# Patient Record
Sex: Female | Born: 1945 | Race: White | Hispanic: No | Marital: Married | Smoking: Former smoker
Health system: Southern US, Community
[De-identification: ages and names within clinical notes are randomized; demographics above are authoritative.]

## PROBLEM LIST (undated history)

## (undated) DIAGNOSIS — U071 COVID-19: Secondary | ICD-10-CM

## (undated) DIAGNOSIS — D649 Anemia, unspecified: Secondary | ICD-10-CM

## (undated) DIAGNOSIS — C801 Malignant (primary) neoplasm, unspecified: Secondary | ICD-10-CM

## (undated) DIAGNOSIS — M4800 Spinal stenosis, site unspecified: Secondary | ICD-10-CM

## (undated) DIAGNOSIS — G4730 Sleep apnea, unspecified: Secondary | ICD-10-CM

## (undated) DIAGNOSIS — J309 Allergic rhinitis, unspecified: Secondary | ICD-10-CM

## (undated) DIAGNOSIS — M549 Dorsalgia, unspecified: Secondary | ICD-10-CM

## (undated) DIAGNOSIS — R911 Solitary pulmonary nodule: Secondary | ICD-10-CM

## (undated) DIAGNOSIS — Z8489 Family history of other specified conditions: Secondary | ICD-10-CM

## (undated) DIAGNOSIS — E785 Hyperlipidemia, unspecified: Secondary | ICD-10-CM

## (undated) DIAGNOSIS — J449 Chronic obstructive pulmonary disease, unspecified: Secondary | ICD-10-CM

## (undated) DIAGNOSIS — R197 Diarrhea, unspecified: Secondary | ICD-10-CM

## (undated) DIAGNOSIS — I10 Essential (primary) hypertension: Secondary | ICD-10-CM

## (undated) DIAGNOSIS — D369 Benign neoplasm, unspecified site: Secondary | ICD-10-CM

## (undated) HISTORY — PX: EYE SURGERY: SHX253

## (undated) HISTORY — PX: OVARIAN CYST SURGERY: SHX726

## (undated) HISTORY — PX: MASTECTOMY: SHX3

## (undated) HISTORY — PX: BREAST BIOPSY: SHX20

---

## 2000-11-22 HISTORY — PX: ABDOMINAL HYSTERECTOMY: SHX81

## 2008-11-22 HISTORY — PX: CHOLECYSTECTOMY: SHX55

## 2009-08-13 ENCOUNTER — Ambulatory Visit: Payer: Self-pay | Admitting: General Practice

## 2009-09-09 ENCOUNTER — Ambulatory Visit: Payer: Self-pay | Admitting: General Surgery

## 2009-09-15 ENCOUNTER — Ambulatory Visit: Payer: Self-pay | Admitting: General Surgery

## 2010-10-30 ENCOUNTER — Ambulatory Visit: Payer: Self-pay | Admitting: Internal Medicine

## 2012-01-06 ENCOUNTER — Ambulatory Visit: Payer: Self-pay | Admitting: Internal Medicine

## 2012-12-25 ENCOUNTER — Ambulatory Visit: Payer: Self-pay | Admitting: Ophthalmology

## 2013-02-13 ENCOUNTER — Ambulatory Visit: Payer: Self-pay | Admitting: Internal Medicine

## 2013-10-24 ENCOUNTER — Ambulatory Visit: Payer: Self-pay | Admitting: Internal Medicine

## 2014-02-14 ENCOUNTER — Ambulatory Visit: Payer: Self-pay | Admitting: Internal Medicine

## 2015-11-18 ENCOUNTER — Other Ambulatory Visit: Payer: Self-pay | Admitting: Internal Medicine

## 2015-11-18 DIAGNOSIS — R911 Solitary pulmonary nodule: Secondary | ICD-10-CM

## 2015-11-20 ENCOUNTER — Ambulatory Visit
Admission: RE | Admit: 2015-11-20 | Discharge: 2015-11-20 | Disposition: A | Discharge status: Home / Self Care | Payer: Medicare Other | Source: Ambulatory Visit | Attending: Internal Medicine | Admitting: Internal Medicine

## 2015-11-20 DIAGNOSIS — R911 Solitary pulmonary nodule: Secondary | ICD-10-CM | POA: Insufficient documentation

## 2015-11-20 MED ORDER — IOHEXOL 300 MG/ML  SOLN
75.0000 mL | Freq: Once | INTRAMUSCULAR | Status: AC | PRN
Start: 2015-11-20 — End: 2015-11-20
  Administered 2015-11-20: 75 mL via INTRAVENOUS

## 2015-11-28 ENCOUNTER — Other Ambulatory Visit: Payer: Self-pay | Admitting: Internal Medicine

## 2015-11-28 DIAGNOSIS — R911 Solitary pulmonary nodule: Secondary | ICD-10-CM

## 2016-05-26 ENCOUNTER — Ambulatory Visit
Admission: RE | Admit: 2016-05-26 | Discharge: 2016-05-26 | Disposition: A | Discharge status: Home / Self Care | Payer: Medicare Other | Source: Ambulatory Visit | Attending: Internal Medicine | Admitting: Internal Medicine

## 2016-05-26 ENCOUNTER — Ambulatory Visit: Admission: RE | Admit: 2016-05-26 | Payer: Medicare Other | Source: Ambulatory Visit

## 2016-05-26 DIAGNOSIS — R911 Solitary pulmonary nodule: Secondary | ICD-10-CM | POA: Diagnosis present

## 2016-05-26 LAB — POCT I-STAT CREATININE: CREATININE: 0.6 mg/dL (ref 0.44–1.00)

## 2016-05-26 MED ORDER — IOPAMIDOL (ISOVUE-300) INJECTION 61%
75.0000 mL | Freq: Once | INTRAVENOUS | Status: AC | PRN
  Administered 2016-05-26: 75 mL via INTRAVENOUS

## 2016-07-08 ENCOUNTER — Other Ambulatory Visit: Payer: Self-pay | Admitting: Internal Medicine

## 2016-07-08 DIAGNOSIS — Z1231 Encounter for screening mammogram for malignant neoplasm of breast: Secondary | ICD-10-CM

## 2016-07-15 ENCOUNTER — Other Ambulatory Visit: Payer: Self-pay | Admitting: Internal Medicine

## 2016-07-15 ENCOUNTER — Ambulatory Visit
Admission: RE | Admit: 2016-07-15 | Discharge: 2016-07-15 | Disposition: A | Discharge status: Home / Self Care | Payer: Medicare Other | Source: Ambulatory Visit | Attending: Internal Medicine | Admitting: Internal Medicine

## 2016-07-15 DIAGNOSIS — Z1231 Encounter for screening mammogram for malignant neoplasm of breast: Secondary | ICD-10-CM | POA: Insufficient documentation

## 2016-08-09 ENCOUNTER — Other Ambulatory Visit: Payer: Self-pay | Admitting: Physical Medicine and Rehabilitation

## 2016-08-09 DIAGNOSIS — M5136 Other intervertebral disc degeneration, lumbar region: Secondary | ICD-10-CM

## 2016-09-03 ENCOUNTER — Ambulatory Visit
Admission: RE | Admit: 2016-09-03 | Discharge: 2016-09-03 | Disposition: A | Discharge status: Home / Self Care | Payer: Medicare Other | Source: Ambulatory Visit | Attending: Physical Medicine and Rehabilitation | Admitting: Physical Medicine and Rehabilitation

## 2016-09-03 DIAGNOSIS — M5136 Other intervertebral disc degeneration, lumbar region: Secondary | ICD-10-CM | POA: Insufficient documentation

## 2016-09-03 DIAGNOSIS — M5137 Other intervertebral disc degeneration, lumbosacral region: Secondary | ICD-10-CM | POA: Insufficient documentation

## 2016-09-03 DIAGNOSIS — M47896 Other spondylosis, lumbar region: Secondary | ICD-10-CM | POA: Diagnosis not present

## 2016-09-03 DIAGNOSIS — M4186 Other forms of scoliosis, lumbar region: Secondary | ICD-10-CM | POA: Diagnosis not present

## 2016-09-06 ENCOUNTER — Other Ambulatory Visit: Payer: Self-pay | Admitting: Physical Medicine and Rehabilitation

## 2016-09-06 DIAGNOSIS — M5136 Other intervertebral disc degeneration, lumbar region: Secondary | ICD-10-CM

## 2016-09-06 DIAGNOSIS — M5416 Radiculopathy, lumbar region: Secondary | ICD-10-CM

## 2016-09-06 DIAGNOSIS — D3610 Benign neoplasm of peripheral nerves and autonomic nervous system, unspecified: Secondary | ICD-10-CM

## 2016-09-07 ENCOUNTER — Other Ambulatory Visit: Payer: Self-pay | Admitting: Physical Medicine and Rehabilitation

## 2016-09-28 ENCOUNTER — Encounter: Payer: Self-pay | Admitting: Radiology

## 2016-09-28 ENCOUNTER — Ambulatory Visit
Admission: RE | Admit: 2016-09-28 | Discharge: 2016-09-28 | Disposition: A | Discharge status: Home / Self Care | Payer: Medicare Other | Source: Ambulatory Visit | Attending: Physical Medicine and Rehabilitation | Admitting: Physical Medicine and Rehabilitation

## 2016-09-28 ENCOUNTER — Ambulatory Visit: Admission: RE | Admit: 2016-09-28 | Payer: Medicare Other | Source: Ambulatory Visit

## 2016-09-28 DIAGNOSIS — M5416 Radiculopathy, lumbar region: Secondary | ICD-10-CM

## 2016-09-28 DIAGNOSIS — M419 Scoliosis, unspecified: Secondary | ICD-10-CM | POA: Diagnosis not present

## 2016-09-28 DIAGNOSIS — D3610 Benign neoplasm of peripheral nerves and autonomic nervous system, unspecified: Secondary | ICD-10-CM | POA: Insufficient documentation

## 2016-09-28 DIAGNOSIS — M5116 Intervertebral disc disorders with radiculopathy, lumbar region: Secondary | ICD-10-CM | POA: Diagnosis present

## 2016-09-28 DIAGNOSIS — M5136 Other intervertebral disc degeneration, lumbar region: Secondary | ICD-10-CM

## 2016-09-28 LAB — POCT I-STAT CREATININE: Creatinine, Ser: 0.7 mg/dL (ref 0.44–1.00)

## 2016-09-28 MED ORDER — GADOBENATE DIMEGLUMINE 529 MG/ML IV SOLN
15.0000 mL | Freq: Once | INTRAVENOUS | Status: AC | PRN
  Administered 2016-09-28: 13 mL via INTRAVENOUS

## 2016-11-05 ENCOUNTER — Other Ambulatory Visit: Payer: Self-pay | Admitting: Neurosurgery

## 2016-11-05 DIAGNOSIS — D497 Neoplasm of unspecified behavior of endocrine glands and other parts of nervous system: Secondary | ICD-10-CM

## 2017-02-01 ENCOUNTER — Ambulatory Visit
Admission: RE | Admit: 2017-02-01 | Discharge: 2017-02-01 | Disposition: A | Discharge status: Home / Self Care | Payer: Medicare Other | Source: Ambulatory Visit | Attending: Neurosurgery | Admitting: Neurosurgery

## 2017-02-01 DIAGNOSIS — M47816 Spondylosis without myelopathy or radiculopathy, lumbar region: Secondary | ICD-10-CM | POA: Diagnosis not present

## 2017-02-01 DIAGNOSIS — M419 Scoliosis, unspecified: Secondary | ICD-10-CM | POA: Insufficient documentation

## 2017-02-01 DIAGNOSIS — D497 Neoplasm of unspecified behavior of endocrine glands and other parts of nervous system: Secondary | ICD-10-CM | POA: Insufficient documentation

## 2017-02-01 MED ORDER — GADOBENATE DIMEGLUMINE 529 MG/ML IV SOLN: 15.0000 mL | Freq: Once | INTRAVENOUS | Status: DC | PRN

## 2017-02-17 ENCOUNTER — Other Ambulatory Visit: Payer: Self-pay | Admitting: Neurosurgery

## 2017-02-17 DIAGNOSIS — D497 Neoplasm of unspecified behavior of endocrine glands and other parts of nervous system: Secondary | ICD-10-CM

## 2017-03-03 ENCOUNTER — Other Ambulatory Visit
Admission: RE | Admit: 2017-03-03 | Discharge: 2017-03-03 | Disposition: A | Discharge status: Home / Self Care | Payer: Medicare Other | Source: Ambulatory Visit | Attending: Nurse Practitioner | Admitting: Nurse Practitioner

## 2017-03-03 DIAGNOSIS — R197 Diarrhea, unspecified: Secondary | ICD-10-CM | POA: Diagnosis present

## 2017-03-03 LAB — C DIFFICILE QUICK SCREEN W PCR REFLEX
C Diff antigen: NEGATIVE
C Diff interpretation: NOT DETECTED
C Diff toxin: NEGATIVE

## 2017-03-03 LAB — GASTROINTESTINAL PANEL BY PCR, STOOL (REPLACES STOOL CULTURE)

## 2017-05-16 ENCOUNTER — Encounter: Payer: Self-pay | Admitting: *Deleted

## 2017-05-16 ENCOUNTER — Ambulatory Visit
Admission: RE | Admit: 2017-05-16 | Discharge: 2017-05-16 | Disposition: A | Discharge status: Home / Self Care | Payer: Medicare Other | Source: Ambulatory Visit | Attending: Unknown Physician Specialty | Admitting: Unknown Physician Specialty

## 2017-05-16 ENCOUNTER — Ambulatory Visit: Payer: Medicare Other | Admitting: Certified Registered Nurse Anesthetist

## 2017-05-16 ENCOUNTER — Encounter
Admission: RE | Disposition: A | Discharge status: Home / Self Care | Payer: Self-pay | Source: Ambulatory Visit | Attending: Unknown Physician Specialty

## 2017-05-16 DIAGNOSIS — Z87891 Personal history of nicotine dependence: Secondary | ICD-10-CM | POA: Insufficient documentation

## 2017-05-16 DIAGNOSIS — J449 Chronic obstructive pulmonary disease, unspecified: Secondary | ICD-10-CM | POA: Diagnosis not present

## 2017-05-16 DIAGNOSIS — K648 Other hemorrhoids: Secondary | ICD-10-CM | POA: Insufficient documentation

## 2017-05-16 DIAGNOSIS — Z7982 Long term (current) use of aspirin: Secondary | ICD-10-CM | POA: Diagnosis not present

## 2017-05-16 DIAGNOSIS — Z86010 Personal history of colonic polyps: Secondary | ICD-10-CM | POA: Insufficient documentation

## 2017-05-16 DIAGNOSIS — K621 Rectal polyp: Secondary | ICD-10-CM | POA: Diagnosis not present

## 2017-05-16 DIAGNOSIS — G4730 Sleep apnea, unspecified: Secondary | ICD-10-CM | POA: Diagnosis not present

## 2017-05-16 DIAGNOSIS — E785 Hyperlipidemia, unspecified: Secondary | ICD-10-CM | POA: Diagnosis not present

## 2017-05-16 DIAGNOSIS — D123 Benign neoplasm of transverse colon: Secondary | ICD-10-CM | POA: Diagnosis not present

## 2017-05-16 DIAGNOSIS — Z1211 Encounter for screening for malignant neoplasm of colon: Secondary | ICD-10-CM | POA: Diagnosis not present

## 2017-05-16 DIAGNOSIS — D125 Benign neoplasm of sigmoid colon: Secondary | ICD-10-CM | POA: Insufficient documentation

## 2017-05-16 HISTORY — DX: Allergic rhinitis, unspecified: J30.9

## 2017-05-16 HISTORY — DX: Sleep apnea, unspecified: G47.30

## 2017-05-16 HISTORY — DX: Diarrhea, unspecified: R19.7

## 2017-05-16 HISTORY — DX: Hyperlipidemia, unspecified: E78.5

## 2017-05-16 HISTORY — DX: Benign neoplasm, unspecified site: D36.9

## 2017-05-16 HISTORY — PX: COLONOSCOPY WITH PROPOFOL: SHX5780

## 2017-05-16 HISTORY — DX: Chronic obstructive pulmonary disease, unspecified: J44.9

## 2017-05-16 SURGERY — COLONOSCOPY WITH PROPOFOL
Anesthesia: General

## 2017-05-16 MED ORDER — SODIUM CHLORIDE 0.9 % IV SOLN: INTRAVENOUS | Status: DC

## 2017-05-16 MED ORDER — SODIUM CHLORIDE 0.9 % IV SOLN
INTRAVENOUS | Status: DC
  Administered 2017-05-16: 13:00:00 via INTRAVENOUS

## 2017-05-16 MED ORDER — PROPOFOL 10 MG/ML IV BOLUS
INTRAVENOUS | Status: DC | PRN
  Administered 2017-05-16: 70 mg via INTRAVENOUS
  Administered 2017-05-16: 20 mg via INTRAVENOUS

## 2017-05-16 MED ORDER — PROPOFOL 500 MG/50ML IV EMUL
INTRAVENOUS | Status: AC
  Filled 2017-05-16: qty 50

## 2017-05-16 MED ORDER — LIDOCAINE HCL (CARDIAC) 20 MG/ML IV SOLN
INTRAVENOUS | Status: DC | PRN
  Administered 2017-05-16: 40 mg via INTRAVENOUS

## 2017-05-16 MED ORDER — PROPOFOL 500 MG/50ML IV EMUL
INTRAVENOUS | Status: DC | PRN
  Administered 2017-05-16: 140 ug/kg/min via INTRAVENOUS

## 2017-05-16 NOTE — Transfer of Care (Signed)
Immediate Anesthesia Transfer of Care Note  Patient: Danielle Santana  Procedure(s) Performed: Procedure(s): COLONOSCOPY WITH PROPOFOL (N/A)  Patient Location: PACU  Anesthesia Type:General  Level of Consciousness: awake, alert  and oriented  Airway & Oxygen Therapy: Patient Spontanous Breathing and Patient connected to nasal cannula oxygen  Post-op Assessment: Report given to RN and Post -op Vital signs reviewed and stable  Post vital signs: Reviewed and stable  Last Vitals:  Vitals:   05/16/17 1240  BP: (!) 184/103  Pulse: (!) 110  Resp: 20  Temp: 36.8 C    Last Pain:  Vitals:   05/16/17 1240  TempSrc: Tympanic         Complications: No apparent anesthesia complications

## 2017-05-16 NOTE — Anesthesia Post-op Follow-up Note (Cosign Needed)
Anesthesia QCDR form completed.        

## 2017-05-16 NOTE — H&P (Signed)
Primary Care Physician:  Kirk Ruths, MD Primary Gastroenterologist:  Dr. Vira Agar  Pre-Procedure History & Physical: HPI:  Danielle Santana is a 71 y.o. female is here for an colonoscopy.   Past Medical History:  Diagnosis Date  . Adenomatous polyps   . Allergic rhinitis   . COPD (chronic obstructive pulmonary disease) ([place])   . Diarrhea   . Hyperlipidemia   . Sleep apnea    declines cpap for now     Past Surgical History:  Procedure Laterality Date  . BREAST BIOPSY Left    Stereo biopsy - negative    Prior to Admission medications   Medication Sig Start Date End Date Taking? Authorizing Provider  aspirin 325 MG EC tablet Take 325 mg by mouth daily.   Yes [provider]  augmented betamethasone dipropionate (DIPROLENE-AF) 0.05 % cream Apply topically daily as needed.   Yes [provider]  colestipol (COLESTID) 1 g tablet Take 1 g by mouth 2 (two) times daily.   Yes [provider]  diphenhydrAMINE (BENADRYL) 25 mg capsule Take 25 mg by mouth daily.   Yes [provider]  fluticasone (FLONASE) 50 MCG/ACT nasal spray Place 2 sprays into both nostrils daily as needed for allergies or rhinitis.   Yes [provider]  ibuprofen (ADVIL,MOTRIN) 600 MG tablet Take 600 mg by mouth at bedtime.   Yes [provider]  Multiple Vitamin (MULTIVITAMIN) tablet Take 1 tablet by mouth daily.   Yes [provider]  pravastatin (PRAVACHOL) 40 MG tablet Take 40 mg by mouth every evening.   Yes [provider]  vitamin B-12 (CYANOCOBALAMIN) 100 MCG tablet Take 100 mcg by mouth daily.   Yes [provider]    Allergies as of 03/07/2017  . (No Known Allergies)    Family History  Problem Relation Age of Onset  . Breast cancer Paternal Grandmother     Social History   Social History  . Marital status: Married    Spouse name: N/A  . Number of children: N/A  . Years of education: N/A    Occupational History  . Not on file.   Social History Main Topics  . Smoking status: Former Research scientist (life sciences)  . Smokeless tobacco: Never Used  . Alcohol use Yes     Comment: occasional  . Drug use: No  . Sexual activity: Not on file   Other Topics Concern  . Not on file   Social History Narrative  . No narrative on file    Review of Systems: See HPI, otherwise negative ROS  Physical Exam: BP (!) 184/103   Pulse (!) 110   Temp 98.3 F (36.8 C) (Tympanic)   Resp 20   Ht 5' 2.75" (1.594 m)   Wt 68 kg (150 lb)   SpO2 97%   BMI 26.78 kg/m  General:   Alert,  pleasant and cooperative in NAD Head:  Normocephalic and atraumatic. Neck:  Supple; no masses or thyromegaly. Lungs:  Clear throughout to auscultation.    Heart:  Regular rate and rhythm. Abdomen:  Soft, nontender and nondistended. Normal bowel sounds, without guarding, and without rebound.   Neurologic:  Alert and  oriented x4;  grossly normal neurologically.  Impression/Plan: Danielle Santana is here for an colonoscopy to be [facility]  Risks, benefits, limitations, and alternatives regarding  colonoscopy have been reviewed with the patient.  Questions have been answered.  All parties agreeable.   Gaylyn Cheers, MD  05/16/2017, 1:03 PM

## 2017-05-16 NOTE — Anesthesia Procedure Notes (Signed)
Performed by: Ewing Fandino Pre-anesthesia Checklist: Emergency Drugs available, Patient identified, Suction available, Patient being monitored and Timeout performed Patient Re-evaluated:Patient Re-evaluated prior to inductionOxygen Delivery Method: Nasal cannula Intubation Type: IV induction       

## 2017-05-16 NOTE — Op Note (Signed)
[facility]astroenter[facility] Patient Name: Danielle Santana Procedure Date: 05/16/2017 1:09 PM MRN: [id number] Account #: [id number] Date of Birth: 03-07-1946 Admit Type: Outpatient Age: 71 Room: [facility] ENDO ROOM 3 Gender: Female Note Status: Finalized Procedure:            Colonoscopy Indications:          High risk colon cancer surveillance: Personal history                        of colonic polyps Providers:            Manya Silvas, MD Referring MD:         Ocie Cornfield. Ouida Sills MD, MD (Referring MD) Medicines:            Propofol per Anesthesia Complications:        No immediate complications. Procedure:            Pre-Anesthesia Assessment:                       - After reviewing the risks and benefits, the patient                        was deemed in satisfactory condition to undergo the                        procedure.                       After obtaining informed consent, the colonoscope was                        passed under direct vision. Throughout the procedure,                        the patient's blood pressure, pulse, and oxygen                        saturations were monitored continuously. The                        Colonoscope was introduced through the anus and                        advanced to the the cecum, identified by appendiceal                        orifice and ileocecal valve. The colonoscopy was                        performed without difficulty. The patient tolerated the                        procedure well. The quality of the bowel preparation                        was excellent. Findings:      Four sessile polyps were found in the rectum, sigmoid colon and       transverse colon. The polyps were diminutive in size. These polyps were       removed with a jumbo cold forceps. Resection and retrieval were complete.  Three very small diffuse angioectasias without bleeding were found in       the cecum.      Internal  hemorrhoids were found during endoscopy. The hemorrhoids were       small.      The exam was otherwise without abnormality. Impression:           - Four diminutive polyps in the rectum, in the sigmoid                        colon and in the transverse colon, removed with a jumbo                        cold forceps. Resected and retrieved.                       - Three non-bleeding colonic angioectasias.                       - Internal hemorrhoids.                       - The examination was otherwise normal. Recommendation:       - Await pathology results. Manya Silvas, MD 05/16/2017 1:38:27 PM This report has been signed electronically. Number of Addenda: 0 Note Initiated On: 05/16/2017 1:09 PM Scope Withdrawal Time: 0 hours 16 minutes 26 seconds  Total Procedure Duration: 0 hours 22 minutes 47 seconds       [facility]

## 2017-05-16 NOTE — Anesthesia Preprocedure Evaluation (Signed)
Anesthesia Evaluation  Patient identified by MRN, date of birth, ID band Patient awake    Reviewed: Allergy & Precautions, H&P , NPO status , Patient's Chart, lab work & pertinent test results, reviewed documented beta blocker date and time   Airway Mallampati: III   Neck ROM: full    Dental  (+) Poor Dentition, Teeth Intact   Pulmonary neg pulmonary ROS, sleep apnea and Continuous Positive Airway Pressure Ventilation , COPD, former smoker,    Pulmonary exam normal        Cardiovascular negative cardio ROS Normal cardiovascular exam Rhythm:regular Rate:Normal     Neuro/Psych negative neurological ROS  negative psych ROS   GI/Hepatic negative GI ROS, Neg liver ROS,   Endo/Other  negative endocrine ROS  Renal/GU negative Renal ROS  negative genitourinary   Musculoskeletal   Abdominal   Peds  Hematology negative hematology ROS (+)   Anesthesia Other Findings Past Medical History: No date: Adenomatous polyps No date: Allergic rhinitis No date: COPD (chronic obstructive pulmonary disease) (* No date: Diarrhea No date: Hyperlipidemia No date: Sleep apnea     Comment: declines cpap for now  Past Surgical History: No date: BREAST BIOPSY Left     Comment: Stereo biopsy - negative BMI    Body Mass Index:  26.78 kg/m     Reproductive/Obstetrics negative OB ROS                             Anesthesia Physical Anesthesia Plan  ASA: III  Anesthesia Plan: General   Post-op Pain Management:    Induction:   PONV Risk Score and Plan:   Airway Management Planned:   Additional Equipment:   Intra-op Plan:   Post-operative Plan:   Informed Consent: I have reviewed the patients History and Physical, chart, labs and discussed the procedure including the risks, benefits and alternatives for the proposed anesthesia with the patient or authorized representative who has indicated his/her  understanding and acceptance.   Dental Advisory Given  Plan Discussed with: CRNA  Anesthesia Plan Comments:         Anesthesia Quick Evaluation

## 2017-05-18 LAB — SURGICAL PATHOLOGY

## 2017-05-19 NOTE — Anesthesia Postprocedure Evaluation (Signed)
Anesthesia Post Note  Patient: Danielle Santana  Procedure(s) Performed: Procedure(s) (LRB): COLONOSCOPY WITH PROPOFOL (N/A)  Patient location during evaluation: PACU Anesthesia Type: General Level of consciousness: awake and alert Pain management: pain level controlled Vital Signs Assessment: post-procedure vital signs reviewed and stable Respiratory status: spontaneous breathing, nonlabored ventilation, respiratory function stable and patient connected to nasal cannula oxygen Cardiovascular status: blood pressure returned to baseline and stable Postop Assessment: no signs of nausea or vomiting Anesthetic complications: no     Last Vitals:  Vitals:   05/16/17 1403 05/16/17 1413  BP: (!) 149/99 (!) 171/99  Pulse: 93 94  Resp: 18 17  Temp:      Last Pain:  Vitals:   05/16/17 1343  TempSrc: Tympanic                 Molli Barrows

## 2017-07-09 IMAGING — CT CT CHEST W/ CM
2 of 3 series · 15 of 36 positions shown, 18 images · IV contrast (omnipaque)
Comparison: Report from [facility] study 11/13/2015. Images are not
available for direct comparison.

CLINICAL DATA: Hemoptysis 1 week ago.  Chronic cough.  Smoking.

EXAM:
CT CHEST WITH CONTRAST
TECHNIQUE: Multidetector CT imaging of the chest was performed during
intravenous contrast administration.
CONTRAST:  75mL OMNIPAQUE IOHEXOL 300 MG/ML  SOLN

[Series 2: routine chest with · axial · 0.65mm/px · z∈[-592,-356]mm · 12 of 57 slices shown, 15 images]
[im 5/57  mediastinal]
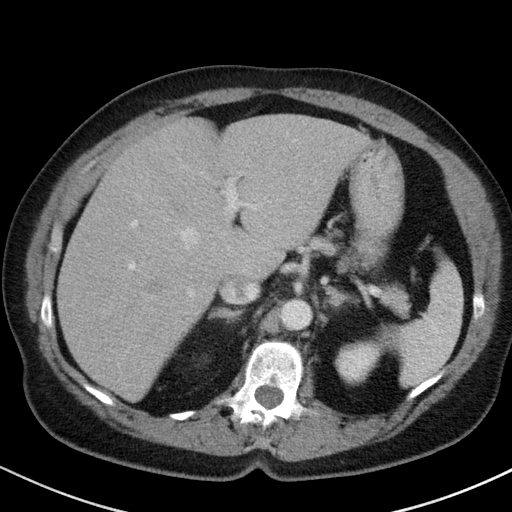
[im 5/57  lung]
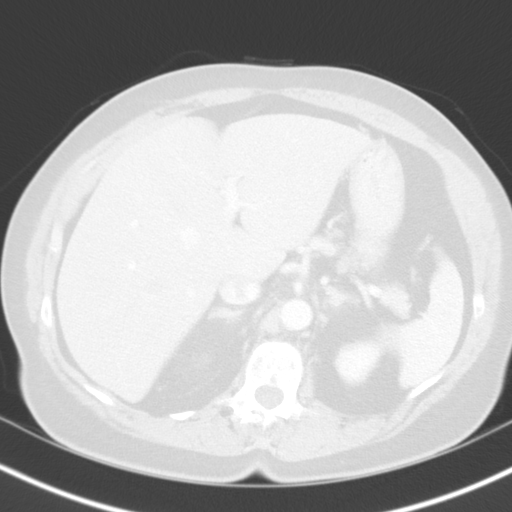
[im 9/57  lung]
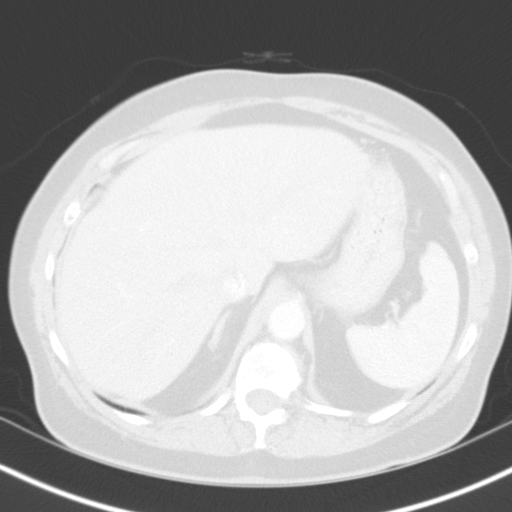
[im 13/57  lung]
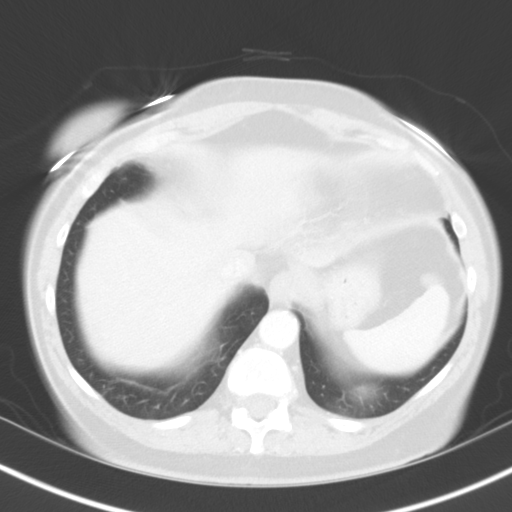
[im 17/57  lung]
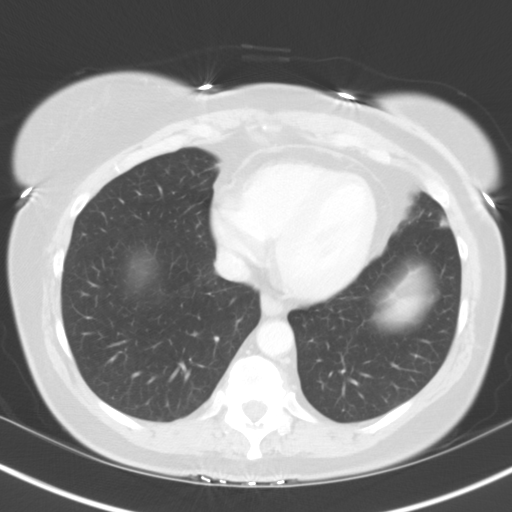
[im 21/57  mediastinal]
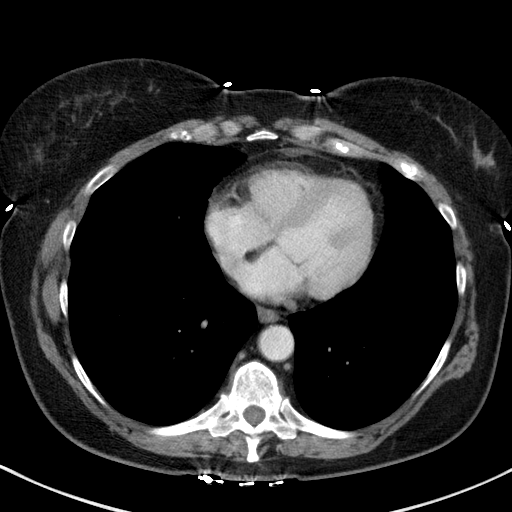
[im 21/57  lung]
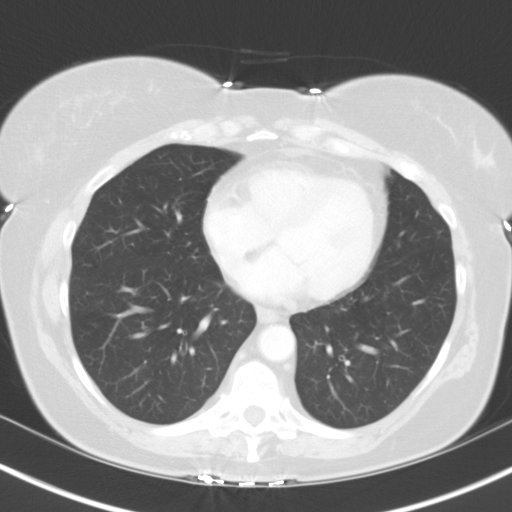
[im 25/57  lung]
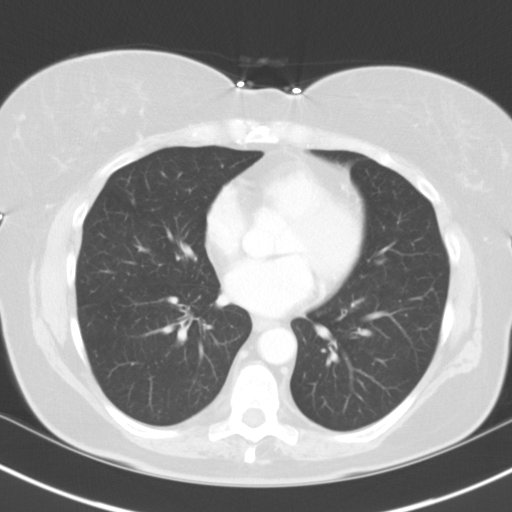
[im 32/57  lung]
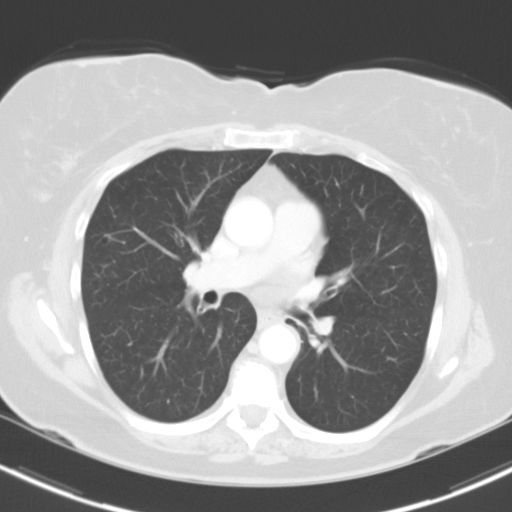
[im 36/57  lung]
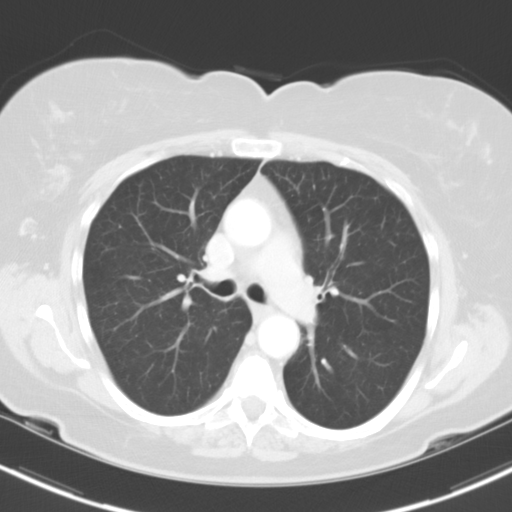
[im 40/57  mediastinal]
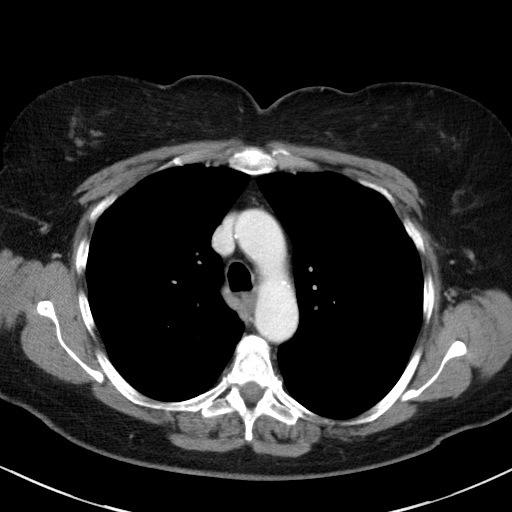
[im 40/57  lung]
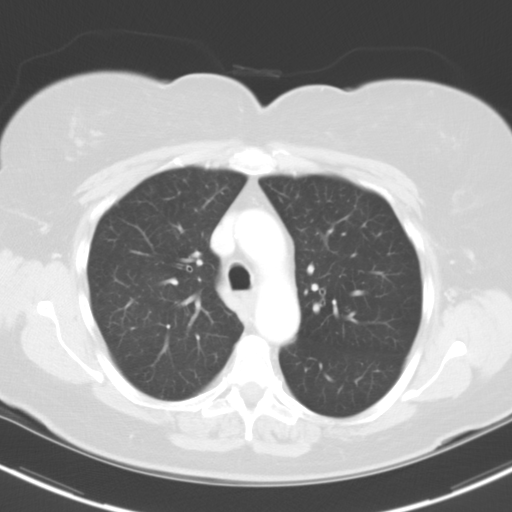
[im 44/57  lung]
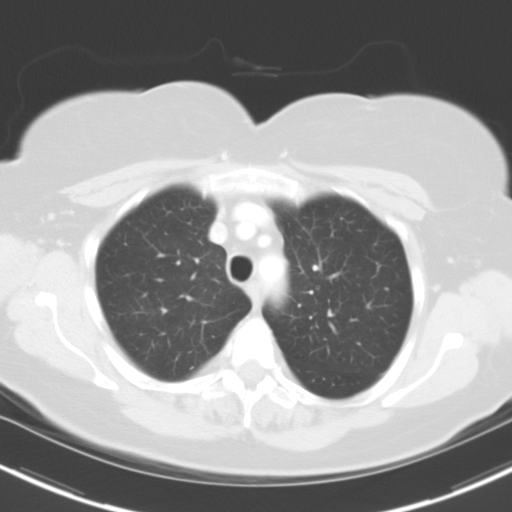
[im 48/57  lung]
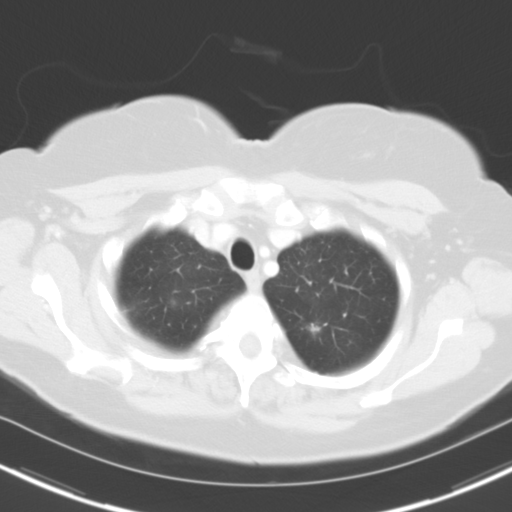
[im 52/57  lung]
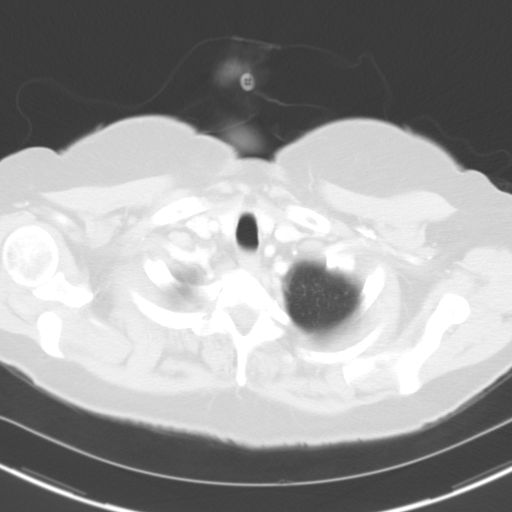

[Series 5: cor routine chest with · coronal · 0.57mm/px · 3 of 146 slices shown]
[im 30/146  lung]
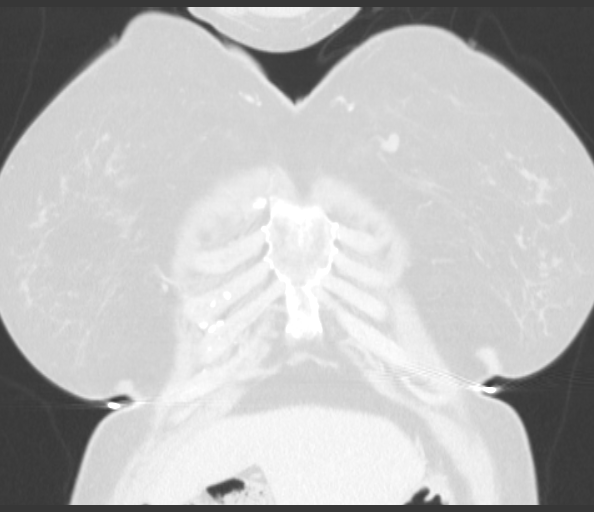
[im 59/146  lung]
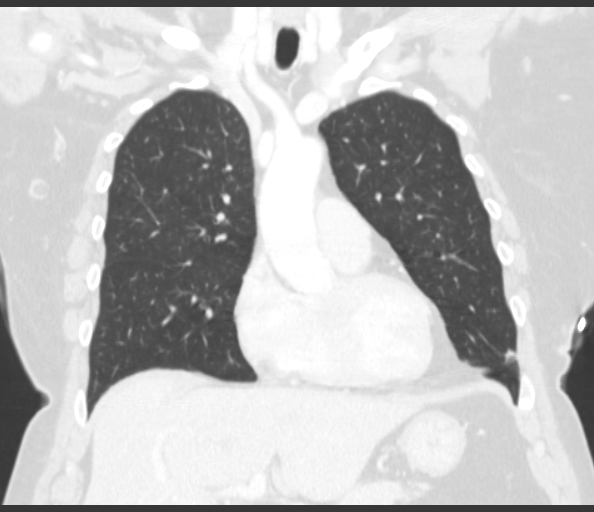
[im 88/146  lung]
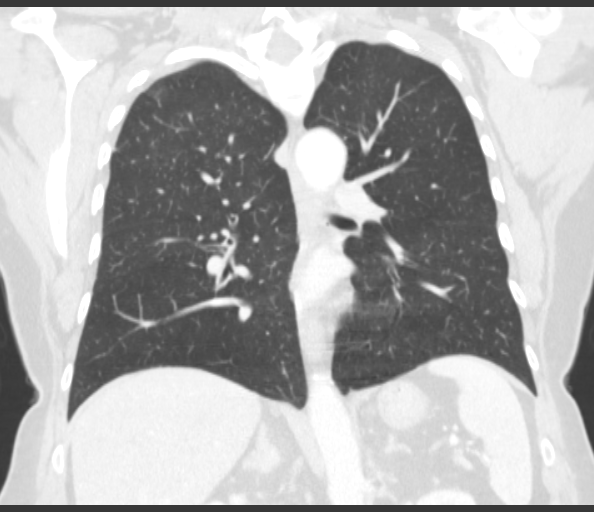

[15 of 36 positions shown; findings below may reference images not displayed]

FINDINGS: No nodule within the left lower lung as questioned on prior study.
There is a nodule in the left upper lobe measuring 6 mm on image 10
that warrants follow-up. No additional pulmonary nodules. No
confluent opacities or effusions.

Heart is normal size. Aorta is normal caliber. No mediastinal,
hilar, or axillary adenopathy. Chest wall soft tissues are
unremarkable. Imaging into the upper abdomen shows no acute
findings.

No acute bony abnormality or focal bone lesion. Degenerative changes
in the thoracic spine.
IMPRESSION: No nodule in the left lower lung. There is a 6 mm mixed ground-glass
and solid nodule in the left upper lobe. Given risk factors for
bronchogenic carcinoma, follow-up chest CT at 6 - 12 months is
recommended. This recommendation follows the consensus statement:
Guidelines for Management of Small Pulmonary Nodules Detected on CT
Scans: A Statement from the [HOSPITAL] as published in

## 2017-11-28 ENCOUNTER — Other Ambulatory Visit: Payer: Self-pay | Admitting: Internal Medicine

## 2017-11-28 DIAGNOSIS — Z1231 Encounter for screening mammogram for malignant neoplasm of breast: Secondary | ICD-10-CM

## 2017-12-01 ENCOUNTER — Other Ambulatory Visit: Payer: Self-pay | Admitting: Internal Medicine

## 2017-12-01 DIAGNOSIS — R911 Solitary pulmonary nodule: Secondary | ICD-10-CM

## 2017-12-12 ENCOUNTER — Other Ambulatory Visit: Payer: Self-pay | Admitting: Internal Medicine

## 2017-12-12 ENCOUNTER — Ambulatory Visit
Admission: RE | Admit: 2017-12-12 | Discharge: 2017-12-12 | Disposition: A | Discharge status: Home / Self Care | Payer: Medicare Other | Source: Ambulatory Visit | Attending: Internal Medicine | Admitting: Internal Medicine

## 2017-12-12 DIAGNOSIS — I700 Atherosclerosis of aorta: Secondary | ICD-10-CM | POA: Diagnosis not present

## 2017-12-12 DIAGNOSIS — R911 Solitary pulmonary nodule: Secondary | ICD-10-CM | POA: Diagnosis present

## 2017-12-12 DIAGNOSIS — R918 Other nonspecific abnormal finding of lung field: Secondary | ICD-10-CM | POA: Diagnosis not present

## 2017-12-12 DIAGNOSIS — N6489 Other specified disorders of breast: Secondary | ICD-10-CM | POA: Diagnosis not present

## 2017-12-14 ENCOUNTER — Other Ambulatory Visit: Payer: Self-pay | Admitting: Internal Medicine

## 2017-12-14 DIAGNOSIS — R911 Solitary pulmonary nodule: Secondary | ICD-10-CM

## 2017-12-20 ENCOUNTER — Ambulatory Visit
Admission: RE | Admit: 2017-12-20 | Discharge: 2017-12-20 | Disposition: A | Discharge status: Home / Self Care | Payer: Medicare Other | Source: Ambulatory Visit | Attending: Internal Medicine | Admitting: Internal Medicine

## 2017-12-20 DIAGNOSIS — N6489 Other specified disorders of breast: Secondary | ICD-10-CM | POA: Diagnosis present

## 2017-12-20 DIAGNOSIS — N6314 Unspecified lump in the right breast, lower inner quadrant: Secondary | ICD-10-CM | POA: Insufficient documentation

## 2017-12-21 ENCOUNTER — Other Ambulatory Visit: Payer: Self-pay | Admitting: Internal Medicine

## 2017-12-21 DIAGNOSIS — N6310 Unspecified lump in the right breast, unspecified quadrant: Secondary | ICD-10-CM

## 2017-12-21 DIAGNOSIS — R928 Other abnormal and inconclusive findings on diagnostic imaging of breast: Secondary | ICD-10-CM

## 2017-12-22 ENCOUNTER — Ambulatory Visit
Admission: RE | Admit: 2017-12-22 | Discharge: 2017-12-22 | Disposition: A | Discharge status: Home / Self Care | Payer: Medicare Other | Source: Ambulatory Visit | Attending: Internal Medicine | Admitting: Internal Medicine

## 2017-12-22 DIAGNOSIS — C50311 Malignant neoplasm of lower-inner quadrant of right female breast: Secondary | ICD-10-CM | POA: Insufficient documentation

## 2017-12-22 DIAGNOSIS — N6310 Unspecified lump in the right breast, unspecified quadrant: Secondary | ICD-10-CM | POA: Diagnosis present

## 2017-12-22 DIAGNOSIS — R928 Other abnormal and inconclusive findings on diagnostic imaging of breast: Secondary | ICD-10-CM

## 2017-12-22 HISTORY — PX: BREAST BIOPSY: SHX20

## 2017-12-26 ENCOUNTER — Other Ambulatory Visit: Payer: Self-pay | Admitting: Pathology

## 2017-12-30 LAB — SURGICAL PATHOLOGY

## 2018-01-02 ENCOUNTER — Encounter: Payer: Self-pay | Admitting: Diagnostic Radiology

## 2018-01-31 ENCOUNTER — Ambulatory Visit
Admission: RE | Admit: 2018-01-31 | Discharge: 2018-01-31 | Disposition: A | Discharge status: Home / Self Care | Payer: Medicare Other | Source: Ambulatory Visit | Attending: Neurosurgery | Admitting: Neurosurgery

## 2018-01-31 DIAGNOSIS — M5136 Other intervertebral disc degeneration, lumbar region: Secondary | ICD-10-CM | POA: Diagnosis not present

## 2018-01-31 DIAGNOSIS — D497 Neoplasm of unspecified behavior of endocrine glands and other parts of nervous system: Secondary | ICD-10-CM | POA: Diagnosis not present

## 2018-01-31 LAB — POCT I-STAT CREATININE: Creatinine, Ser: 0.7 mg/dL (ref 0.44–1.00)

## 2018-01-31 MED ORDER — GADOBENATE DIMEGLUMINE 529 MG/ML IV SOLN
14.0000 mL | Freq: Once | INTRAVENOUS | Status: AC | PRN
  Administered 2018-01-31: 20 mL via INTRAVENOUS

## 2018-04-25 ENCOUNTER — Other Ambulatory Visit
Admission: RE | Admit: 2018-04-25 | Discharge: 2018-04-25 | Disposition: A | Discharge status: Home / Self Care | Payer: Medicare Other | Source: Ambulatory Visit | Attending: Internal Medicine | Admitting: Internal Medicine

## 2018-04-25 DIAGNOSIS — R197 Diarrhea, unspecified: Secondary | ICD-10-CM | POA: Diagnosis present

## 2018-04-25 LAB — C DIFFICILE QUICK SCREEN W PCR REFLEX
C DIFFICLE (CDIFF) ANTIGEN: NEGATIVE
C Diff interpretation: NOT DETECTED
C Diff toxin: NEGATIVE

## 2018-10-24 ENCOUNTER — Other Ambulatory Visit: Payer: Self-pay | Admitting: Internal Medicine

## 2018-10-24 DIAGNOSIS — R918 Other nonspecific abnormal finding of lung field: Secondary | ICD-10-CM

## 2018-12-12 ENCOUNTER — Ambulatory Visit
Admission: RE | Admit: 2018-12-12 | Discharge: 2018-12-12 | Disposition: A | Discharge status: Home / Self Care | Payer: Medicare Other | Source: Ambulatory Visit | Attending: Internal Medicine | Admitting: Internal Medicine

## 2018-12-12 DIAGNOSIS — R918 Other nonspecific abnormal finding of lung field: Secondary | ICD-10-CM | POA: Diagnosis not present

## 2018-12-19 ENCOUNTER — Other Ambulatory Visit: Payer: Self-pay | Admitting: Internal Medicine

## 2018-12-19 DIAGNOSIS — R911 Solitary pulmonary nodule: Secondary | ICD-10-CM

## 2019-12-13 ENCOUNTER — Ambulatory Visit
Admission: RE | Admit: 2019-12-13 | Discharge: 2019-12-13 | Disposition: A | Discharge status: Home / Self Care | Payer: Medicare Other | Source: Ambulatory Visit | Attending: Internal Medicine | Admitting: Internal Medicine

## 2019-12-13 ENCOUNTER — Other Ambulatory Visit: Payer: Self-pay

## 2019-12-13 DIAGNOSIS — R911 Solitary pulmonary nodule: Secondary | ICD-10-CM | POA: Insufficient documentation

## 2020-01-01 ENCOUNTER — Other Ambulatory Visit: Payer: Self-pay | Admitting: Internal Medicine

## 2020-01-01 DIAGNOSIS — R918 Other nonspecific abnormal finding of lung field: Secondary | ICD-10-CM
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
## 2020-06-25 ENCOUNTER — Encounter (INDEPENDENT_AMBULATORY_CARE_PROVIDER_SITE_OTHER): Payer: Self-pay | Admitting: Ophthalmology

## 2020-06-25 ENCOUNTER — Ambulatory Visit (INDEPENDENT_AMBULATORY_CARE_PROVIDER_SITE_OTHER): Payer: Medicare Other | Admitting: Ophthalmology

## 2020-06-25 ENCOUNTER — Other Ambulatory Visit: Payer: Self-pay

## 2020-06-25 DIAGNOSIS — I10 Essential (primary) hypertension: Secondary | ICD-10-CM | POA: Diagnosis not present

## 2020-06-25 DIAGNOSIS — H35033 Hypertensive retinopathy, bilateral: Secondary | ICD-10-CM

## 2020-06-25 DIAGNOSIS — H31002 Unspecified chorioretinal scars, left eye: Secondary | ICD-10-CM | POA: Diagnosis not present

## 2020-06-25 DIAGNOSIS — H3581 Retinal edema: Secondary | ICD-10-CM

## 2020-06-25 DIAGNOSIS — H353121 Nonexudative age-related macular degeneration, left eye, early dry stage: Secondary | ICD-10-CM | POA: Diagnosis not present

## 2020-06-25 DIAGNOSIS — H25813 Combined forms of age-related cataract, bilateral: Secondary | ICD-10-CM

## 2020-10-09 ENCOUNTER — Encounter (INDEPENDENT_AMBULATORY_CARE_PROVIDER_SITE_OTHER): Payer: Self-pay

## 2020-10-09 ENCOUNTER — Encounter (INDEPENDENT_AMBULATORY_CARE_PROVIDER_SITE_OTHER): Payer: Medicare Other | Admitting: Ophthalmology

## 2020-12-03 ENCOUNTER — Ambulatory Visit
Admission: RE | Admit: 2020-12-03 | Discharge: 2020-12-03 | Disposition: A | Discharge status: Home / Self Care | Payer: Medicare Other | Source: Ambulatory Visit | Attending: Internal Medicine | Admitting: Internal Medicine

## 2020-12-03 ENCOUNTER — Other Ambulatory Visit: Payer: Self-pay

## 2020-12-03 DIAGNOSIS — R918 Other nonspecific abnormal finding of lung field: Secondary | ICD-10-CM | POA: Diagnosis not present

## 2021-05-06 ENCOUNTER — Other Ambulatory Visit: Payer: Self-pay | Admitting: Orthopedic Surgery

## 2021-05-06 DIAGNOSIS — M75101 Unspecified rotator cuff tear or rupture of right shoulder, not specified as traumatic: Secondary | ICD-10-CM

## 2021-05-20 ENCOUNTER — Other Ambulatory Visit: Payer: Self-pay | Admitting: Orthopedic Surgery

## 2021-05-20 ENCOUNTER — Other Ambulatory Visit: Payer: Self-pay

## 2021-05-20 ENCOUNTER — Ambulatory Visit
Admission: RE | Admit: 2021-05-20 | Discharge: 2021-05-20 | Disposition: A | Discharge status: Home / Self Care | Payer: Medicare Other | Source: Ambulatory Visit | Attending: Orthopedic Surgery | Admitting: Orthopedic Surgery

## 2021-05-20 DIAGNOSIS — M48061 Spinal stenosis, lumbar region without neurogenic claudication: Secondary | ICD-10-CM

## 2021-05-20 DIAGNOSIS — M75101 Unspecified rotator cuff tear or rupture of right shoulder, not specified as traumatic: Secondary | ICD-10-CM | POA: Diagnosis present

## 2021-05-22 ENCOUNTER — Other Ambulatory Visit: Payer: Self-pay

## 2021-05-22 ENCOUNTER — Ambulatory Visit
Admission: RE | Admit: 2021-05-22 | Discharge: 2021-05-22 | Disposition: A | Discharge status: Home / Self Care | Payer: Medicare Other | Source: Ambulatory Visit | Attending: Orthopedic Surgery | Admitting: Orthopedic Surgery

## 2021-05-22 DIAGNOSIS — M48061 Spinal stenosis, lumbar region without neurogenic claudication: Secondary | ICD-10-CM

## 2021-06-19 ENCOUNTER — Other Ambulatory Visit: Payer: Self-pay | Admitting: Orthopedic Surgery

## 2021-07-06 ENCOUNTER — Encounter
Admission: RE | Admit: 2021-07-06 | Discharge: 2021-07-06 | Disposition: A | Discharge status: Home / Self Care | Payer: Medicare Other | Source: Ambulatory Visit | Attending: Orthopedic Surgery | Admitting: Orthopedic Surgery

## 2021-07-06 ENCOUNTER — Other Ambulatory Visit: Payer: Self-pay

## 2021-07-06 HISTORY — DX: Family history of other specified conditions: Z84.89

## 2021-07-06 HISTORY — DX: COVID-19: U07.1

## 2021-07-06 HISTORY — DX: Solitary pulmonary nodule: R91.1

## 2021-07-06 HISTORY — DX: Anemia, unspecified: D64.9

## 2021-07-06 HISTORY — DX: Spinal stenosis, site unspecified: M48.00

## 2021-07-06 HISTORY — DX: Malignant (primary) neoplasm, unspecified: C80.1

## 2021-07-06 HISTORY — DX: Essential (primary) hypertension: I10

## 2021-07-06 HISTORY — DX: Dorsalgia, unspecified: M54.9

## 2021-07-06 NOTE — Patient Instructions (Addendum)
Your procedure is scheduled on:07-13-21 Monday Report to the Registration Desk on the 1st floor of the [facility].Then proceed to the 2nd floor Surgery Desk in the [facility] To find out your arrival time, please call [phone] between 1PM - 3PM on:07-10-21 Friday  REMEMBER: Instructions that are not followed completely may result in serious medical risk, up to and including death; or upon the discretion of your surgeon and anesthesiologist your surgery may need to be rescheduled.  Do not eat food after midnight the night before surgery.  No gum chewing, lozengers or hard candies.  You may however, drink CLEAR liquids up to 2 hours before you are scheduled to arrive for your surgery. Do not drink anything within 2 hours of your scheduled arrival time.  Clear liquids include: - water  - apple juice without pulp - gatorade  - black coffee or tea (Do NOT add milk or creamers to the coffee or tea) Do NOT drink anything that is not on this list.  In addition, your doctor has ordered for you to drink the provided  Ensure Pre-Surgery Clear Carbohydrate Drink Drinking this carbohydrate drink up to two hours before surgery helps to reduce insulin resistance and improve patient outcomes. Please complete drinking 2 hours prior to scheduled arrival time.  Do not take any medication the day of surgery  Use your Albuterol Inhaler the day of surgery and bring your Albuterol Inhaler to the hospital  Stop your 81 mg Aspirin NOW (07-06-21)  One week prior to surgery: Stop Anti-inflammatories (NSAIDS) such as Mobic (Meloxicam), Advil, Aleve, Ibuprofen, Motrin, Naproxen, Naprosyn and Aspirin based products such as Excedrin, Goodys Powder, BC Powder.You may however, continue to take Tylenol if needed for pain up until the day of surgery.  Stop ANY OVER THE COUNTER supplements/vitamins NOW (07-06-21) until after surgery (Vitamin C, Vitamin B12, Vitamin D and Calcium)  No Alcohol for 24 hours before  or after surgery.  No Smoking including e-cigarettes for 24 hours prior to surgery.  No chewable tobacco products for at least 6 hours prior to surgery.  No nicotine patches on the day of surgery.  Do not use any "recreational" drugs for at least a week prior to your surgery.  Please be advised that the combination of cocaine and anesthesia may have negative outcomes, up to and including death. If you test positive for cocaine, your surgery will be cancelled.  On the morning of surgery brush your teeth with toothpaste and water, you may rinse your mouth with mouthwash if you wish. Do not swallow any toothpaste or mouthwash.  Do not wear jewelry, make-up, hairpins, clips or nail polish.  Do not wear lotions, powders, or perfumes.   Do not shave body from the neck down 48 hours prior to surgery just in case you cut yourself which could leave a site for infection.  Also, freshly shaved skin may become irritated if using the CHG soap.  Contact lenses, hearing aids and dentures may not be worn into surgery.  Do not bring valuables to the hospital. [facility] is not responsible for any missing/lost belongings or valuables.   Use CHG Soap as directed on instruction sheet  Notify your doctor if there is any change in your medical condition (cold, fever, infection).  Wear comfortable clothing (specific to your surgery type) to the hospital.  After surgery, you can help prevent lung complications by doing breathing exercises.  Take deep breaths and cough every 1-2 hours. Your doctor may order a  device called an Incentive Spirometer to help you take deep breaths. When coughing or sneezing, hold a pillow firmly against your incision with both hands. This is called "splinting." Doing this helps protect your incision. It also decreases belly discomfort.  If you are being admitted to the hospital overnight, leave your suitcase in the car. After surgery it may be brought to your room.  If you  are being discharged the day of surgery, you will not be allowed to drive home. You will need a responsible adult (18 years or older) to drive you home and stay with you that night.   If you are taking public transportation, you will need to have a responsible adult (18 years or older) with you. Please confirm with your physician that it is acceptable to use public transportation.   Please call the [facility] Dept. at [phone] if you have any questions about these instructions.  Surgery Visitation Policy:  Patients undergoing a surgery or procedure may have one family member or support person with them as long as that person is not COVID-19 positive or experiencing its symptoms.  That person may remain in the waiting area during the procedure.  Inpatient Visitation:    Visiting hours are 7 a.m. to 8 p.m. Inpatients will be allowed two visitors daily. The visitors may change each day during the patient's stay. No visitors under the age of 6. Any visitor under the age of 66 must be accompanied by an adult. The visitor must pass COVID-19 screenings, use hand sanitizer when entering and exiting the patient's room and wear a mask at all times, including in the patient's room. Patients must also wear a mask when staff or their visitor are in the room. Masking is required regardless of vaccination status.

## 2021-07-07 ENCOUNTER — Encounter
Admission: RE | Admit: 2021-07-07 | Discharge: 2021-07-07 | Disposition: A | Discharge status: Home / Self Care | Payer: Medicare Other | Source: Ambulatory Visit | Attending: Orthopedic Surgery | Admitting: Orthopedic Surgery

## 2021-07-07 DIAGNOSIS — Z01818 Encounter for other preprocedural examination: Secondary | ICD-10-CM | POA: Insufficient documentation

## 2021-07-07 DIAGNOSIS — I10 Essential (primary) hypertension: Secondary | ICD-10-CM | POA: Diagnosis not present

## 2021-07-07 LAB — CBC
HCT: 42 % (ref 36.0–46.0)
Hemoglobin: 14.2 g/dL (ref 12.0–15.0)
MCH: 30.1 pg (ref 26.0–34.0)
MCHC: 33.8 g/dL (ref 30.0–36.0)
MCV: 89.2 fL (ref 80.0–100.0)
Platelets: 323 10*3/uL (ref 150–400)
RBC: 4.71 MIL/uL (ref 3.87–5.11)
RDW: 13.5 % (ref 11.5–15.5)
WBC: 6.9 10*3/uL (ref 4.0–10.5)
nRBC: 0 % (ref 0.0–0.2)

## 2021-07-13 ENCOUNTER — Encounter: Payer: Self-pay | Admitting: Orthopedic Surgery

## 2021-07-13 ENCOUNTER — Ambulatory Visit: Payer: Medicare Other

## 2021-07-13 ENCOUNTER — Ambulatory Visit: Payer: Medicare Other | Admitting: Urgent Care

## 2021-07-13 ENCOUNTER — Ambulatory Visit
Admission: RE | Admit: 2021-07-13 | Discharge: 2021-07-13 | Disposition: A | Discharge status: Home / Self Care | Payer: Medicare Other | Attending: Orthopedic Surgery | Admitting: Orthopedic Surgery

## 2021-07-13 ENCOUNTER — Other Ambulatory Visit: Payer: Self-pay

## 2021-07-13 ENCOUNTER — Encounter
Admission: RE | Disposition: A | Discharge status: Home / Self Care | Payer: Self-pay | Source: Home / Self Care | Attending: Orthopedic Surgery

## 2021-07-13 ENCOUNTER — Ambulatory Visit: Payer: Medicare Other | Admitting: Anesthesiology

## 2021-07-13 DIAGNOSIS — M66811 Spontaneous rupture of other tendons, right shoulder: Secondary | ICD-10-CM | POA: Diagnosis not present

## 2021-07-13 DIAGNOSIS — Z7982 Long term (current) use of aspirin: Secondary | ICD-10-CM | POA: Diagnosis not present

## 2021-07-13 DIAGNOSIS — Z79899 Other long term (current) drug therapy: Secondary | ICD-10-CM | POA: Diagnosis not present

## 2021-07-13 DIAGNOSIS — M65811 Other synovitis and tenosynovitis, right shoulder: Secondary | ICD-10-CM | POA: Diagnosis not present

## 2021-07-13 DIAGNOSIS — M25811 Other specified joint disorders, right shoulder: Secondary | ICD-10-CM | POA: Diagnosis present

## 2021-07-13 DIAGNOSIS — Z419 Encounter for procedure for purposes other than remedying health state, unspecified: Secondary | ICD-10-CM

## 2021-07-13 DIAGNOSIS — Z87891 Personal history of nicotine dependence: Secondary | ICD-10-CM | POA: Insufficient documentation

## 2021-07-13 DIAGNOSIS — M75121 Complete rotator cuff tear or rupture of right shoulder, not specified as traumatic: Secondary | ICD-10-CM | POA: Diagnosis not present

## 2021-07-13 HISTORY — PX: SHOULDER ARTHROSCOPY WITH SUBACROMIAL DECOMPRESSION AND OPEN ROTATOR C: SHX5688

## 2021-07-13 SURGERY — SHOULDER ARTHROSCOPY WITH SUBACROMIAL DECOMPRESSION AND OPEN ROTATOR CUFF REPAIR, OPEN BICEPS TENDON REPAIR
Anesthesia: General | Site: Shoulder | Laterality: Right

## 2021-07-13 MED ORDER — MIDAZOLAM HCL 2 MG/2ML IJ SOLN
INTRAMUSCULAR | Status: AC
  Administered 2021-07-13: 1 mg via INTRAVENOUS
  Filled 2021-07-13: qty 2

## 2021-07-13 MED ORDER — ONDANSETRON HCL 4 MG/2ML IJ SOLN
INTRAMUSCULAR | Status: DC | PRN
  Administered 2021-07-13: 4 mg via INTRAVENOUS

## 2021-07-13 MED ORDER — PHENYLEPHRINE HCL (PRESSORS) 10 MG/ML IV SOLN
INTRAVENOUS | Status: DC | PRN
  Administered 2021-07-13: 100 ug via INTRAVENOUS

## 2021-07-13 MED ORDER — IPRATROPIUM-ALBUTEROL 0.5-2.5 (3) MG/3ML IN SOLN
3.0000 mL | RESPIRATORY_TRACT | Status: DC
  Administered 2021-07-13: 3 mL via RESPIRATORY_TRACT

## 2021-07-13 MED ORDER — GLYCOPYRROLATE 0.2 MG/ML IJ SOLN
INTRAMUSCULAR | Status: AC
  Filled 2021-07-13: qty 2

## 2021-07-13 MED ORDER — LACTATED RINGERS IV SOLN
INTRAVENOUS | Status: DC | PRN
  Administered 2021-07-13: 12000 mL

## 2021-07-13 MED ORDER — ACETAMINOPHEN 10 MG/ML IV SOLN
INTRAVENOUS | Status: AC
  Filled 2021-07-13: qty 100

## 2021-07-13 MED ORDER — LIDOCAINE HCL (CARDIAC) PF 100 MG/5ML IV SOSY
PREFILLED_SYRINGE | INTRAVENOUS | Status: DC | PRN
  Administered 2021-07-13: 100 mg via INTRAVENOUS

## 2021-07-13 MED ORDER — ONDANSETRON HCL 4 MG/2ML IJ SOLN: 4.0000 mg | Freq: Once | INTRAMUSCULAR | Status: DC | PRN

## 2021-07-13 MED ORDER — LACTATED RINGERS IV SOLN: INTRAVENOUS | Status: DC

## 2021-07-13 MED ORDER — EPINEPHRINE PF 1 MG/ML IJ SOLN
INTRAMUSCULAR | Status: AC
  Filled 2021-07-13: qty 4

## 2021-07-13 MED ORDER — DEXAMETHASONE SODIUM PHOSPHATE 10 MG/ML IJ SOLN
INTRAMUSCULAR | Status: DC | PRN
  Administered 2021-07-13: 10 mg via INTRAVENOUS

## 2021-07-13 MED ORDER — ROCURONIUM BROMIDE 10 MG/ML (PF) SYRINGE
PREFILLED_SYRINGE | INTRAVENOUS | Status: AC
  Filled 2021-07-13: qty 10

## 2021-07-13 MED ORDER — SUGAMMADEX SODIUM 500 MG/5ML IV SOLN
INTRAVENOUS | Status: DC | PRN
  Administered 2021-07-13: 300 mg via INTRAVENOUS

## 2021-07-13 MED ORDER — BUPIVACAINE HCL (PF) 0.5 % IJ SOLN
INTRAMUSCULAR | Status: AC
  Filled 2021-07-13: qty 10

## 2021-07-13 MED ORDER — PROPOFOL 10 MG/ML IV BOLUS
INTRAVENOUS | Status: AC
  Filled 2021-07-13: qty 20

## 2021-07-13 MED ORDER — FENTANYL CITRATE (PF) 100 MCG/2ML IJ SOLN
INTRAMUSCULAR | Status: AC
  Administered 2021-07-13: 50 ug via INTRAVENOUS
  Filled 2021-07-13: qty 2

## 2021-07-13 MED ORDER — TRANEXAMIC ACID-NACL 1000-0.7 MG/100ML-% IV SOLN
INTRAVENOUS | Status: AC
  Filled 2021-07-13: qty 100

## 2021-07-13 MED ORDER — SODIUM CHLORIDE 0.9 % IV SOLN
INTRAVENOUS | Status: DC | PRN
  Administered 2021-07-13: 30 ug/min via INTRAVENOUS

## 2021-07-13 MED ORDER — ONDANSETRON 4 MG PO TBDP: 4.0000 mg | ORAL_TABLET | Freq: Three times a day (TID) | ORAL | 0 refills | Status: AC | PRN

## 2021-07-13 MED ORDER — CEFAZOLIN SODIUM-DEXTROSE 2-4 GM/100ML-% IV SOLN
2.0000 g | INTRAVENOUS | Status: AC
  Administered 2021-07-13 (×2): 2 g via INTRAVENOUS

## 2021-07-13 MED ORDER — BUPIVACAINE LIPOSOME 1.3 % IJ SUSP
INTRAMUSCULAR | Status: DC | PRN
  Administered 2021-07-13: 20 mL

## 2021-07-13 MED ORDER — CHLORHEXIDINE GLUCONATE 0.12 % MT SOLN: 15.0000 mL | Freq: Once | OROMUCOSAL | Status: AC

## 2021-07-13 MED ORDER — GLYCOPYRROLATE 0.2 MG/ML IJ SOLN
INTRAMUSCULAR | Status: DC | PRN
Start: 2021-07-13 — End: 2021-07-13
  Administered 2021-07-13: .2 mg via INTRAVENOUS

## 2021-07-13 MED ORDER — BUPIVACAINE HCL (PF) 0.5 % IJ SOLN
INTRAMUSCULAR | Status: DC | PRN
  Administered 2021-07-13: 10 mL

## 2021-07-13 MED ORDER — CEFAZOLIN SODIUM 1 G IJ SOLR
INTRAMUSCULAR | Status: AC
  Filled 2021-07-13: qty 30

## 2021-07-13 MED ORDER — FAMOTIDINE 20 MG PO TABS
ORAL_TABLET | ORAL | Status: AC
  Administered 2021-07-13: 20 mg via ORAL
  Filled 2021-07-13: qty 1

## 2021-07-13 MED ORDER — ROCURONIUM BROMIDE 100 MG/10ML IV SOLN
INTRAVENOUS | Status: DC | PRN
  Administered 2021-07-13: 50 mg via INTRAVENOUS

## 2021-07-13 MED ORDER — CEFAZOLIN SODIUM 1 G IJ SOLR
INTRAMUSCULAR | Status: AC
  Filled 2021-07-13: qty 10

## 2021-07-13 MED ORDER — OXYCODONE HCL 5 MG PO TABS: 5.0000 mg | ORAL_TABLET | ORAL | 0 refills | Status: AC | PRN

## 2021-07-13 MED ORDER — FENTANYL CITRATE (PF) 100 MCG/2ML IJ SOLN: 25.0000 ug | INTRAMUSCULAR | Status: DC | PRN

## 2021-07-13 MED ORDER — VASOPRESSIN 20 UNIT/ML IV SOLN
INTRAVENOUS | Status: AC
  Filled 2021-07-13: qty 1

## 2021-07-13 MED ORDER — LIDOCAINE HCL (PF) 2 % IJ SOLN
INTRAMUSCULAR | Status: AC
  Filled 2021-07-13: qty 10

## 2021-07-13 MED ORDER — SUGAMMADEX SODIUM 500 MG/5ML IV SOLN
INTRAVENOUS | Status: AC
  Filled 2021-07-13: qty 5

## 2021-07-13 MED ORDER — BUPIVACAINE LIPOSOME 1.3 % IJ SUSP
INTRAMUSCULAR | Status: AC
  Filled 2021-07-13: qty 20

## 2021-07-13 MED ORDER — PROPOFOL 10 MG/ML IV BOLUS
INTRAVENOUS | Status: DC | PRN
  Administered 2021-07-13: 150 mg via INTRAVENOUS

## 2021-07-13 MED ORDER — CEFAZOLIN SODIUM-DEXTROSE 2-4 GM/100ML-% IV SOLN
INTRAVENOUS | Status: AC
  Filled 2021-07-13: qty 100

## 2021-07-13 MED ORDER — ASPIRIN EC 325 MG PO TBEC: 325.0000 mg | DELAYED_RELEASE_TABLET | Freq: Every day | ORAL | 0 refills | Status: AC

## 2021-07-13 MED ORDER — FAMOTIDINE 20 MG PO TABS: 20.0000 mg | ORAL_TABLET | Freq: Once | ORAL | Status: AC

## 2021-07-13 MED ORDER — ORAL CARE MOUTH RINSE: 15.0000 mL | Freq: Once | OROMUCOSAL | Status: AC

## 2021-07-13 MED ORDER — ONDANSETRON HCL 4 MG/2ML IJ SOLN
INTRAMUSCULAR | Status: AC
  Filled 2021-07-13: qty 6

## 2021-07-13 MED ORDER — ACETAMINOPHEN 500 MG PO TABS: 1000.0000 mg | ORAL_TABLET | Freq: Three times a day (TID) | ORAL | 2 refills | Status: AC

## 2021-07-13 MED ORDER — CHLORHEXIDINE GLUCONATE 0.12 % MT SOLN
OROMUCOSAL | Status: AC
  Administered 2021-07-13: 15 mL via OROMUCOSAL
  Filled 2021-07-13: qty 15

## 2021-07-13 MED ORDER — FENTANYL CITRATE (PF) 100 MCG/2ML IJ SOLN
50.0000 ug | INTRAMUSCULAR | Status: AC | PRN
  Administered 2021-07-13: 50 ug via INTRAVENOUS

## 2021-07-13 MED ORDER — SUCCINYLCHOLINE CHLORIDE 200 MG/10ML IV SOSY
PREFILLED_SYRINGE | INTRAVENOUS | Status: AC
  Filled 2021-07-13: qty 20

## 2021-07-13 MED ORDER — FENTANYL CITRATE (PF) 100 MCG/2ML IJ SOLN
INTRAMUSCULAR | Status: AC
  Filled 2021-07-13: qty 2

## 2021-07-13 MED ORDER — MEPERIDINE HCL 25 MG/ML IJ SOLN: 6.2500 mg | INTRAMUSCULAR | Status: DC | PRN

## 2021-07-13 MED ORDER — IPRATROPIUM-ALBUTEROL 0.5-2.5 (3) MG/3ML IN SOLN
RESPIRATORY_TRACT | Status: AC
  Filled 2021-07-13: qty 3

## 2021-07-13 MED ORDER — MIDAZOLAM HCL 2 MG/2ML IJ SOLN
1.0000 mg | INTRAMUSCULAR | Status: AC | PRN
  Administered 2021-07-13: 1 mg via INTRAVENOUS

## 2021-07-13 MED ORDER — EPHEDRINE SULFATE 50 MG/ML IJ SOLN
INTRAMUSCULAR | Status: DC | PRN
  Administered 2021-07-13 (×2): 5 mg via INTRAVENOUS

## 2021-07-13 MED ORDER — PROPOFOL 10 MG/ML IV BOLUS
INTRAVENOUS | Status: AC
  Filled 2021-07-13: qty 40

## 2021-07-13 SURGICAL SUPPLY — 86 items
ADAPTER IRRIG TUBE 2 SPIKE SOL (ADAPTER) ×4 IMPLANT
ADH SKN CLS APL DERMABOND .7 (GAUZE/BANDAGES/DRESSINGS) ×1
ADPR TBG 2 SPK PMP STRL ASCP (ADAPTER) ×2
ANCH SUT .5 CRC TPR CT 40X40 (SUTURE) ×1
ANCH SUT 2 SWLK 19.1 CLS EYLT (Anchor) ×3 IMPLANT
ANCH SUT 2X2.3 2 STRN TPE (Anchor) ×1 IMPLANT
ANCH SUT 2X2.3 TAPE (Anchor) ×1 IMPLANT
ANCHOR 2.3 SP SGL 1.2 XBRAID (Anchor) ×2 IMPLANT
ANCHOR ICONIX SPEED 2.0 TAPE (Anchor) ×2 IMPLANT
ANCHOR SWIVELOCK BIO 4.75X19.1 (Anchor) ×6 IMPLANT
APL PRP STRL LF DISP 70% ISPRP (MISCELLANEOUS) ×1
BLADE SHAVER 4.5X7 STR FR (MISCELLANEOUS) ×2 IMPLANT
BNDG ADH 2 X3.75 FABRIC TAN LF (GAUZE/BANDAGES/DRESSINGS) ×2 IMPLANT
BNDG ADH XL 3.75X2 STRCH LF (GAUZE/BANDAGES/DRESSINGS) ×1
BUR BR 5.5 WIDE MOUTH (BURR) ×2 IMPLANT
CANNULA PART THRD DISP 5.75X7 (CANNULA) IMPLANT
CANNULA PARTIAL THREAD 2X7 (CANNULA) ×2 IMPLANT
CANNULA TWIST IN 8.25X7CM (CANNULA) ×2 IMPLANT
CANNULA TWIST IN 8.25X9CM (CANNULA) IMPLANT
CHLORAPREP W/TINT 26 (MISCELLANEOUS) ×2 IMPLANT
COOLER POLAR GLACIER W/PUMP (MISCELLANEOUS) ×2 IMPLANT
DERMABOND ADVANCED (GAUZE/BANDAGES/DRESSINGS) ×1
DERMABOND ADVANCED .7 DNX12 (GAUZE/BANDAGES/DRESSINGS) ×1 IMPLANT
DRAPE 3/4 80X56 (DRAPES) ×2 IMPLANT
DRAPE IMP U-DRAPE 54X76 (DRAPES) ×4 IMPLANT
DRAPE INCISE IOBAN 66X45 STRL (DRAPES) ×2 IMPLANT
DRAPE U-SHAPE 47X51 STRL (DRAPES) ×4 IMPLANT
DRSG TEGADERM 4X4.75 (GAUZE/BANDAGES/DRESSINGS) ×6 IMPLANT
ELECT REM PT RETURN 9FT ADLT (ELECTROSURGICAL) ×2
ELECTRODE REM PT RTRN 9FT ADLT (ELECTROSURGICAL) ×1 IMPLANT
GAUZE SPONGE 4X4 12PLY STRL (GAUZE/BANDAGES/DRESSINGS) ×2 IMPLANT
GAUZE XEROFORM 1X8 LF (GAUZE/BANDAGES/DRESSINGS) ×2 IMPLANT
GLOVE SRG 8 PF TXTR STRL LF DI (GLOVE) ×2 IMPLANT
GLOVE SURG ENC MOIS LTX SZ7.5 (GLOVE) ×2 IMPLANT
GLOVE SURG ORTHO LTX SZ8 (GLOVE) ×2 IMPLANT
GLOVE SURG SYN 8.0 (GLOVE) ×2 IMPLANT
GLOVE SURG UNDER POLY LF SZ8 (GLOVE) ×4
GOWN STRL REUS W/ TWL LRG LVL3 (GOWN DISPOSABLE) ×2 IMPLANT
GOWN STRL REUS W/TWL LRG LVL3 (GOWN DISPOSABLE) ×4
GOWN STRL REUS W/TWL XL LVL4 (GOWN DISPOSABLE) ×2 IMPLANT
IV LACTATED RINGER IRRG 3000ML (IV SOLUTION) ×40
IV LR IRRIG 3000ML ARTHROMATIC (IV SOLUTION) ×20 IMPLANT
KIT CORKSCREW KNTLS 3.9 S/T/P (INSTRUMENTS) IMPLANT
KIT STABILIZATION SHOULDER (MISCELLANEOUS) ×2 IMPLANT
KIT SUTURETAK 3.0 INSERT PERC (KITS) IMPLANT
KIT TURNOVER KIT A (KITS) ×2 IMPLANT
MANIFOLD NEPTUNE II (INSTRUMENTS) ×4 IMPLANT
MASK FACE SPIDER DISP (MASK) ×2 IMPLANT
MAT ABSORB  FLUID 56X50 GRAY (MISCELLANEOUS) ×2
MAT ABSORB FLUID 56X50 GRAY (MISCELLANEOUS) ×2 IMPLANT
NDL MAYO CATGUT SZ5 (NEEDLE)
NDL SAFETY ECLIPSE 18X1.5 (NEEDLE) ×1 IMPLANT
NDL SUT 5 .5 CRC TPR PNT MAYO (NEEDLE) IMPLANT
NEEDLE HYPO 18GX1.5 SHARP (NEEDLE) ×2
NEEDLE SCORPION MULTI FIRE (NEEDLE) IMPLANT
PACK ARTHROSCOPY SHOULDER (MISCELLANEOUS) ×2 IMPLANT
PAD ARMBOARD 7.5X6 YLW CONV (MISCELLANEOUS) ×2 IMPLANT
PAD WRAPON POLAR SHDR XLG (MISCELLANEOUS) ×1 IMPLANT
PASSER SUT FIRSTPASS SELF (INSTRUMENTS) ×4 IMPLANT
PASSER SUT SWIFTSTITCH HIP CRT (INSTRUMENTS) ×2 IMPLANT
PENCIL SMOKE EVACUATOR (MISCELLANEOUS) ×2 IMPLANT
SHAVER BLADE BONE CUTTER  5.5 (BLADE) ×1
SHAVER BLADE BONE CUTTER 5.5 (BLADE) ×1 IMPLANT
SLING ULTRA II M (MISCELLANEOUS) ×2 IMPLANT
SPONGE T-LAP 18X18 ~~LOC~~+RFID (SPONGE) ×2 IMPLANT
STAPLER SKIN PROX 35W (STAPLE) IMPLANT
STRAP SAFETY 5IN WIDE (MISCELLANEOUS) ×2 IMPLANT
SUT ETHILON 3-0 (SUTURE) ×2 IMPLANT
SUT LASSO 90 DEG CVD (SUTURE) IMPLANT
SUT LASSO 90 DEG SD STR (SUTURE) IMPLANT
SUT MNCRL 4-0 (SUTURE)
SUT MNCRL 4-0 27XMFL (SUTURE)
SUT PROLENE 0 CT 2 (SUTURE) IMPLANT
SUT VIC AB 0 CT1 36 (SUTURE) IMPLANT
SUT VIC AB 2-0 CT2 27 (SUTURE) IMPLANT
SUT XBRAID 1.4 BLUE (SUTURE) ×2 IMPLANT
SUTURE MNCRL 4-0 27XMF (SUTURE) IMPLANT
SUTURE TAPE 1.3 40 TPR END (SUTURE) ×1 IMPLANT
SUTURETAPE 1.3 40 TPR END (SUTURE) ×2
TAPE CLOTH 3X10 WHT NS LF (GAUZE/BANDAGES/DRESSINGS) ×2 IMPLANT
TAPE MICROFOAM 4IN (TAPE) ×2 IMPLANT
TUBING CONNECTING 10 (TUBING) IMPLANT
TUBING INFLOW SET DBFLO PUMP (TUBING) ×2 IMPLANT
TUBING OUTFLOW SET DBLFO PUMP (TUBING) ×2 IMPLANT
WAND WEREWOLF FLOW 90D (MISCELLANEOUS) ×2 IMPLANT
WRAPON POLAR PAD SHDR XLG (MISCELLANEOUS) ×2

## 2021-07-13 NOTE — Progress Notes (Signed)
Pt's HR 102 and O2 is 94% and Dr. Rosey Bath is okay with those numbers and the patient can be discharged home.

## 2021-07-13 NOTE — Discharge Instructions (Addendum)
Post-Op Instructions - Rotator Cuff Repair  1. Bracing: You will wear a shoulder immobilizer or sling for 6 weeks.   2. Driving: No driving for 3 weeks post-op. When driving, do not wear the immobilizer. Ideally, we recommend no driving for 6 weeks while sling is in place as one arm will be immobilized.   3. Activity: No active lifting for 2 months. Wrist, hand, and elbow motion only. Avoid lifting the upper arm away from the body except for hygiene. You are permitted to bend and straighten the elbow passively only (no active elbow motion). You may use your hand and wrist for typing, writing, and managing utensils (cutting food). Do not lift more than a coffee cup for 8 weeks.  When sleeping or resting, inclined positions (recliner chair or wedge pillow) and a pillow under the forearm for support may provide better comfort for up to 4 weeks.  Avoid long distance travel for 4 weeks.  Return to normal activities after rotator cuff repair repair normally takes 6 months on average. If rehab goes very well, may be able to do most activities at 4 months, except overhead or contact sports.  4. Physical Therapy: Begins 3-4 days after surgery, and proceed 1 time per week for the first 6 weeks, then 1-2 times per week from weeks 6-20 post-op.  5. Medications:  - You will be provided a prescription for narcotic pain medicine. After surgery, take 1-2 narcotic tablets every 4 hours if needed for severe pain.  - A prescription for anti-nausea medication will be provided in case the narcotic medicine causes nausea - take 1 tablet every 6 hours only if nauseated.   - Take tylenol 1000 mg (2 Extra Strength tablets or 3 regular strength) every 8 hours for pain.  May decrease or stop tylenol 5 days after surgery if you are having minimal pain. - Take ASA 325mg/day x 2 weeks to help prevent DVTs/PEs (blood clots).  - DO NOT take ANY nonsteroidal anti-inflammatory pain medications (Advil, Motrin, Ibuprofen, Aleve,  Naproxen, or Naprosyn). These medicines can inhibit healing of your shoulder repair.    If you are taking prescription medication for anxiety, depression, insomnia, muscle spasm, chronic pain, or for attention deficit disorder, you are advised that you are at a higher risk of adverse effects with use of narcotics post-op, including narcotic addiction/dependence, depressed breathing, death. If you use non-prescribed substances: alcohol, marijuana, cocaine, heroin, methamphetamines, etc., you are at a higher risk of adverse effects with use of narcotics post-op, including narcotic addiction/dependence, depressed breathing, death. You are advised that taking > 50 morphine milligram equivalents (MME) of narcotic pain medication per day results in twice the risk of overdose or death. For your prescription provided: oxycodone 5 mg - taking more than 6 tablets per day would result in > 50 morphine milligram equivalents (MME) of narcotic pain medication. Be advised that we will prescribe narcotics short-term, for acute post-operative pain only - 3 weeks for major operations such as shoulder repair/reconstruction surgeries.     6. Post-Op Appointment:  Your first post-op appointment will be 10-14 days post-op.  7. Work or School: For most, but not all procedures, we advise staying out of work or school for at least 1 to 2 weeks in order to recover from the stress of surgery and to allow time for healing.   If you need a work or school note this can be provided.   8. Smoking: If you are a smoker, you need to refrain from   smoking in the postoperative period. The nicotine in cigarettes will inhibit healing of your shoulder repair and decrease the chance of successful repair. Similarly, nicotine containing products (gum, patches) should be avoided.   Post-operative Brace: Apply and remove the brace you received as you were instructed to at the time of fitting and as described in detail as the brace's  instructions for use indicate.  Wear the brace for the period of time prescribed by your physician.  The brace can be cleaned with soap and water and allowed to air dry only.  Should the brace result in increased pain, decreased feeling (numbness/tingling), increased swelling or an overall worsening of your medical condition, please contact your doctor immediately.  If an emergency situation occurs as a result of wearing the brace after normal business hours, please dial 911 and seek immediate medical attention.  Let your doctor know if you have any further questions about the brace issued to you. Refer to the shoulder sling instructions for use if you have any questions regarding the correct fit of your shoulder sling.  [facility]: [phone]  Video that illustrates how to properly use a shoulder sling: "Instructions for Proper Use of an Orthopaedic Sling" [email or web address]    AMBULATORY SURGERY  DISCHARGE INSTRUCTIONS   The drugs that you were given will stay in your system until tomorrow so for the next 24 hours you should not:  Drive an automobile Make any legal decisions Drink any alcoholic beverage   You may resume regular meals tomorrow.  Today it is better to start with liquids and gradually work up to solid foods.  You may eat anything you prefer, but it is better to start with liquids, then soup and crackers, and gradually work up to solid foods.   Please notify your doctor immediately if you have any unusual bleeding, trouble breathing, redness and pain at the surgery site, drainage, fever, or pain not relieved by medication.    Additional Instructions:        Please contact your physician with any problems or Same Day Surgery at [phone], Monday through Friday 6 am to 4 pm, or [place] [facility] number at [phone].       Interscalene Nerve Block with Exparel  -- WEAR GREEN BRACELET FOR 3  DAYS   For your surgery you have received an Interscalene Nerve Block with Exparel. Nerve Blocks affect many types of nerves, including nerves that control movement, pain and normal sensation.  You may experience feelings such as numbness, tingling, heaviness, weakness or the inability to move your arm or the feeling or sensation that your arm has "fallen asleep". A nerve block with Exparel can last up to 5 days.  Usually the weakness wears off first.  The tingling and heaviness usually wear off next.  Finally you may start to notice pain.  Keep in mind that this may occur in any order.  Once a nerve block starts to wear off it is usually completely gone within 60 minutes. ISNB may cause mild shortness of breath, a hoarse voice, blurry vision, unequal pupils, or drooping of the face on the same side as the nerve block.  These symptoms will usually resolve with the numbness.  Very rarely the procedure itself can cause mild seizures. If needed, your surgeon will give you a prescription for pain medication.  It will take about 60 minutes for the oral pain medication to become fully effective.  So, it is recommended that you  start taking this medication before the nerve block first begins to wear off, or when you first begin to feel discomfort. Take your pain medication only as prescribed.  Pain medication can cause sedation and decrease your breathing if you take more than you need for the level of pain that you have. Nausea is a common side effect of many pain medications.  You may want to eat something before taking your pain medicine to prevent nausea. After an Interscalene nerve block, you cannot feel pain, pressure or extremes in temperature in the effected arm.  Because your arm is numb it is at an increased risk for injury.  To decrease the possibility of injury, please practice the following:  While you are awake change the position of your arm frequently to prevent too much pressure on any one area  for prolonged periods of time.  If you have a cast or tight dressing, check the color or your fingers every couple of hours.  Call your surgeon with the appearance of any discoloration (white or blue). If you are given a sling to wear before you go home, please wear it  at all times until the block has completely worn off.  Do not get up at night without your sling. Please contact [facility] or your surgeon if you do not begin to regain sensation after 7 days from the surgery.  Anesthesia may be contacted by calling the Same Day Surgery Department, Mon. through Fri., 6 am to 4 pm at [phone].   If you experience any other problems or concerns, please contact your surgeon's office. If you experience severe or prolonged shortness of breath go to the nearest emergency department.

## 2021-07-13 NOTE — H&P (Signed)
Paper H&P to be scanned into permanent record. H&P reviewed. No significant changes noted.  

## 2021-07-13 NOTE — Op Note (Signed)
SURGERY DATE: 07/13/2021   PRE-OP DIAGNOSIS:  1. Right subacromial impingement 2. Right proximal biceps rupture 3. Right rotator cuff tear (subscapularis and supraspinatus)   POST-OP DIAGNOSIS: 1. Right subacromial impingement 2. Right proximal biceps rupture 3. Right rotator cuff tear (subscapularis and supraspinatus)   PROCEDURES:  1. Right arthroscopic rotator cuff repair  (subscapularis and supraspinatus) 2. Right arthroscopic subacromial decompression 3. Right arthroscopic extensive debridement of shoulder (glenohumeral and subacromial spaces)   SURGEON: Cato Mulligan, MD   ASSISTANT: Anitra Lauth, PA   ANESTHESIA: Gen with Exparel interscalene block   ESTIMATED BLOOD LOSS: 5cc   DRAINS:  none   TOTAL IV FLUIDS: per anesthesia      SPECIMENS: none   IMPLANTS:  - Arthrex 4.65m SwiveLock x 3 - Iconix SPEED double loaded with 1.2 and 2.067mtape x 2     OPERATIVE FINDINGS:  Examination under anesthesia: A careful examination under anesthesia was performed.  Passive range of motion was: FF: 150; ER at side: 45; ER in abduction: 90; IR in abduction: 45.  Anterior load shift: NT.  Posterior load shift: NT.  Sulcus in neutral: NT.  Sulcus in ER: NT.     Intra-operative findings: A thorough arthroscopic examination of the shoulder was performed.  The findings are: 1. Biceps tendon: Not visualized intra-articularly 2. Superior labrum: erythema 3. Posterior labrum and capsule: Degenerative tearing of the posterior labrum 4. Inferior capsule and inferior recess: normal 5. Glenoid cartilage surface: Grade 1 degenerative changes of inferior glenoid 6. Supraspinatus attachment: full-thickness tear of the supraspinatus 7. Posterior rotator cuff attachment: normal 8. Humeral head articular cartilage: Focal area of grade 2 degenerative change 9. Rotator interval: significant synovitis 10: Subscapularis tendon: Full-thickness tear of the superior fibers 11. Anterior labrum:  Mildly degenerative 12. IGHL: normal   OPERATIVE REPORT:    Indications for procedure:  Danielle Santana a [age].o. female with over 9 months of right shoulder pain. Clinical exam and MRI were suggestive of rotator cuff tear involving the supraspinatus and subscapularis, biceps rupture, and subacromial impingement/bursitis.  She had failed extensive nonoperative management including NSAIDs, exercises, and corticosteroid. After discussion of risks, benefits, and alternatives to surgery, the patient elected to proceed.     Procedure in detail:   I identified Danielle Santana the pre-operative holding area.  I marked the operative shoulder with my initials. I reviewed the risks and benefits of the proposed surgical intervention, and the patient wished to proceed.  Anesthesia was then performed with an Exparel interscalene block.  The patient was transferred to the operative suite and placed in the beach chair position.     Appropriate IV antibiotics were administered prior to incision. The operative upper extremity was then prepped and draped in standard fashion. A time out was performed confirming the correct extremity, correct patient, and correct procedure.    I then created a standard posterior portal with an 11 blade. The glenohumeral joint was easily entered with a blunt trocar and the arthroscope introduced. The findings of diagnostic arthroscopy are described above.  Using an oscillating shaver, I debrided degenerative tissue including the synovitic tissue about the rotator interval and anterior and superior labrum.  I also debrided the biceps anchor complex down to a stable base. I then coagulated the inflamed synovium to obtain hemostasis and reduce the risk of post-operative swelling using an Arthrocare radiofrequency device.   I then turned my attention to the arthroscopic subscapularis repair.  A superior anterolateral portal  was made under needle localization.  A 7 mm cannula was placed.   The, comma tissue indicating the superolateral border of the subscapularis was identified readily.  The tip of the coracoid as well as the conjoined tendon and coracoacromial ligaments were visualized after debriding rotator interval tissue.  Tissue about the subscapularis was released anteriorly, superiorly, and posteriorly to allow for improved mobilization.  Using a 70 degree arthroscope, the lesser tuberosity footprint was prepared with a combination of electrocautery and curette. 2 suture tapes were passed in a mattress fashion.  All 4 strands of suture were then loaded onto a 4.75 mm SwiveLock anchor and placed into the prepared footprint with the arm in a neutral position.  This construct appropriately reduced the subscapularis tear.  Utilizing the knotless mechanism of the anchor, an additional stitch was passed through the superior aspect of the subscapularis to further reduce the tear.  The arm was then internally and externally rotated and the subscapularis was noted to move appropriately with rotation.  The remainder of the suture was then cut.   Next, the arthroscope was then introduced into the subacromial space. A direct lateral portal was created with an 11-blade after spinal needle localization. An extensive subacromial bursectomy was performed using a combination of the shaver and Arthrocare wand. The entire acromial undersurface was exposed and the CA ligament was subperiosteally elevated to expose the anterior acromial hook. A burr was used to create a flat anterior and lateral aspect of the acromion, converting it from a Type 2 to a Type 1 acromion. Care was made to keep the deltoid fascia intact.   Next, the arthroscopic supraspinatus repair was performed.  I debrided the poor quality edges of the supraspinatus tendon.  This was a U-shaped tear of the supraspinatus.  I prepared the footprint using a burr to expose bleeding bone.    I then percutaneously placed 1 Iconix SPEED medial row  anchor along the anterior portion of the tear at the articular margin. Another SPEED anchor was placed percutaneously along the posterior portion of the tear at the articular margin. I then shuttled all 8 strands of tape through the rotator cuff just lateral to the musculotendinous junction using a FirstPass suture passer spanning the anterior to posterior extent of the tear. The posterior strands of each suture were passed through an [employer] anchor.  This was placed approximately 2 cm distal to the lateral edge of the footprint in line with the posterior aspect of the tear with appropriate tensioning of each suture prior to final fixation.  Similarly, the anterior strands of each suture were passed through another SwiveLock anchor along the anterior margin of the tear. This construct allowed for excellent reapproximation of the rotator cuff to its native footprint without undue tension.  Appropriate compression was achieved.  The repair was stable to external and internal rotation.   Fluid was evacuated from the shoulder, and the portals were closed with 3-0 Nylon. Xeroform was applied to the portals. A sterile dressing was applied, followed by a Polar Care sleeve and a SlingShot shoulder immobilizer/sling. The patient was awakened from anesthesia without difficulty and was transferred to the PACU in stable condition.    Of note, assistance from a PA was essential to performing the surgery.  PA assisted with patient positioning, retraction, and instrumentation. The surgery would have been more difficult and had longer operative time without PA assistance.   Additionally, this case had increased complexity compared to standard arthroscopic rotator cuff repair  given the involvement of the subscapularis tear. Repair of this tear increased surgical time by 45 minutes and increased complexity due to additional preparation and repair of subscapularis tear, use of additional implants, and creation of an  accessory portal, all of which would have otherwise not occurred.  COMPLICATIONS: none   DISPOSITION: plan for discharge home after recovery in PACU     POSTOPERATIVE PLAN: Remain in sling (except hygiene and elbow/wrist/hand RoM exercises as instructed by PT) x 6 weeks and NWB for this time. PT to begin 3-4 days after surgery.  Large rotator cuff repair rehab protocol with subscapularis restrictions. ASA '325mg'$  daily x 2 weeks for DVT ppx.

## 2021-07-13 NOTE — Anesthesia Procedure Notes (Signed)
Anesthesia Regional Block: Interscalene brachial plexus block   Pre-Anesthetic Checklist: , timeout performed,  Correct Patient, Correct Site, Correct Laterality,  Correct Procedure, Correct Position, site marked,  Risks and benefits discussed,  Surgical consent,  Pre-op evaluation,  At surgeon's request and post-op pain management  Laterality: Upper  Prep: chloraprep       Needles:  Injection technique: Single-shot  Needle Type: Stimiplex     Needle Length: 5cm  Needle Gauge: 22   Needle insertion depth: 3 cm   Additional Needles:   Procedures:, nerve stimulator,,, ultrasound used (permanent image in chart),,     Nerve Stimulator or Paresthesia:  Response: R Forearm, 0.6 mA, 2 ms, 3 cm  Additional Responses:   Narrative:  Start time: 07/13/2021 10:16 AM End time: 07/13/2021 10:21 AM Injection made incrementally with aspirations every 5 mL.  Performed by: Personally  Anesthesiologist: Phill Mutter, MD  Additional Notes: Patient consented for risk and benefits of nerve block including but not limited to nerve damage, failed block, bleeding and infection.  Patient voiced understanding.  Functioning IV was confirmed and monitors were applied.  Timeout done prior to procedure and prior to any sedation being given to the patient.  Patient confirmed procedure site prior to any sedation given to the patient. IVCS with Versed '2mg'$  + Fent 25mg.   A 57m22ga Stimuplex needle was used. Sterile prep,hand hygiene and sterile gloves were used.  Minimal sedation used for procedure.  No paresthesia endorsed by patient during the procedure.  Negative aspiration and negative test dose prior to incremental administration of local anesthetic. The patient tolerated the procedure well with no immediate complications.  Injectate:  1055m,5% bupivacaine + 46m76mparel

## 2021-07-13 NOTE — Anesthesia Preprocedure Evaluation (Signed)
Anesthesia Evaluation  Patient identified by MRN, date of birth, ID band Patient awake    Reviewed: Allergy & Precautions, H&P , NPO status , Patient's Chart, lab work & pertinent test results, reviewed documented beta blocker date and time   History of Anesthesia Complications (+) Family history of anesthesia reaction  Airway Mallampati: III   Neck ROM: full    Dental  (+) Poor Dentition, Teeth Intact   Pulmonary neg pulmonary ROS, sleep apnea , COPD,  COPD inhaler, former smoker,    Pulmonary exam normal        Cardiovascular hypertension, Pt. on medications negative cardio ROS Normal cardiovascular exam Rhythm:regular Rate:Normal     Neuro/Psych negative neurological ROS  negative psych ROS   GI/Hepatic negative GI ROS, Neg liver ROS,   Endo/Other  negative endocrine ROS  Renal/GU negative Renal ROS  negative genitourinary   Musculoskeletal   Abdominal   Peds  Hematology negative hematology ROS (+)   Anesthesia Other Findings Adenomatous polyps    Allergic rhinitis    Anemia    Back pain  receiving injections Cancer (HCC)  right  COPD (chronic obstructive pulmonary disease) (HCC)    COVID-19    Diarrhea    Family history of adverse reaction to anesthesia  paternal great aunt died during surgery  Hyperlipidemia    Hypertension    Lung nodule    Sleep apnea  declines cpap for now   Spinal stenosis       Reproductive/Obstetrics negative OB ROS                            Anesthesia Physical  Anesthesia Plan  ASA: 3  Anesthesia Plan: General   Post-op Pain Management:  Regional for Post-op pain   Induction: Intravenous  PONV Risk Score and Plan: 2 and Propofol infusion, Ondansetron and Midazolam  Airway Management Planned: Oral ETT  Additional Equipment:   Intra-op Plan:   Post-operative Plan:   Informed Consent: I have reviewed the patients History and  Physical, chart, labs and discussed the procedure including the risks, benefits and alternatives for the proposed anesthesia with the patient or authorized representative who has indicated his/her understanding and acceptance.     Dental Advisory Given  Plan Discussed with: CRNA, Anesthesiologist and Surgeon  Anesthesia Plan Comments:        Anesthesia Quick Evaluation

## 2021-07-13 NOTE — Transfer of Care (Signed)
Immediate Anesthesia Transfer of Care Note  Patient: Danielle Santana  Procedure(s) Performed: Right shoulder arthroscopic vs mini-open rotator cuff repair (subscapularis and supraspinatus), subacromial decompression, and biceps tenodesis (Right: Shoulder)  Patient Location: PACU  Anesthesia Type:General  Level of Consciousness: awake, drowsy and patient cooperative  Airway & Oxygen Therapy: Patient Spontanous Breathing and Patient connected to face mask oxygen  Post-op Assessment: Report given to RN and Post -op Vital signs reviewed and stable  Post vital signs: Reviewed and stable  Last Vitals:  Vitals Value Taken Time  BP 132/62 07/13/21 1536  Temp    Pulse 109 07/13/21 1539  Resp 21 07/13/21 1539  SpO2 97 % 07/13/21 1539  Vitals shown include unvalidated device data.  Last Pain:  Vitals:   07/13/21 0937  TempSrc: Tympanic  PainSc: 2          Complications: No notable events documented.

## 2021-07-13 NOTE — Anesthesia Procedure Notes (Signed)
Procedure Name: Intubation Date/Time: 07/13/2021 12:21 PM Performed by: Kelton Pillar, CRNA Pre-anesthesia Checklist: Patient identified, Emergency Drugs available, Suction available and Patient being monitored Patient Re-evaluated:Patient Re-evaluated prior to induction Oxygen Delivery Method: Circle system utilized Preoxygenation: Pre-oxygenation with 100% oxygen Induction Type: IV induction Ventilation: Mask ventilation without difficulty Laryngoscope Size: McGraph and 3 Grade View: Grade I Tube type: Oral Tube size: 6.5 mm Number of attempts: 1 Airway Equipment and Method: Stylet and Oral airway Placement Confirmation: ETT inserted through vocal cords under direct vision, positive ETCO2, breath sounds checked- equal and bilateral and CO2 detector Secured at: 21 cm Tube secured with: Tape Dental Injury: Teeth and Oropharynx as per pre-operative assessment

## 2021-07-14 ENCOUNTER — Encounter: Payer: Self-pay | Admitting: Orthopedic Surgery

## 2021-07-14 NOTE — Anesthesia Postprocedure Evaluation (Signed)
Anesthesia Post Note  Patient: Danielle Santana  Procedure(s) Performed: Right shoulder arthroscopic vs mini-open rotator cuff repair (subscapularis and supraspinatus), subacromial decompression, and biceps tenodesis (Right: Shoulder)  Patient location during evaluation: PACU Anesthesia Type: General Level of consciousness: awake and alert Pain management: pain level controlled Vital Signs Assessment: post-procedure vital signs reviewed and stable Respiratory status: spontaneous breathing, nonlabored ventilation, respiratory function stable and patient connected to nasal cannula oxygen Cardiovascular status: blood pressure returned to baseline and stable Postop Assessment: no apparent nausea or vomiting Anesthetic complications: no   No notable events documented.   Last Vitals:  Vitals:   07/13/21 1647 07/13/21 1722  BP: (!) 107/58 115/60  Pulse: (!) 105 (!) 110  Resp: 16 14  Temp: 36.6 C   SpO2: 95% 94%    Last Pain:  Vitals:   07/14/21 0812  TempSrc:   PainSc: 0-No pain                 Martha Clan

## 2021-08-01 IMAGING — CT CT CHEST W/O CM
2 of 4 series · 13 of 36 positions shown, 16 images · non-contrast
Comparison: CT of the chest on 12/12/2016
COMPARISON: CT of the chest on 12/12/2016

Addendum:
CLINICAL DATA: Yearly lung nodule follow-up. History of RIGHT
breast cancer with lumpectomy 2422. History of smoking; quit 7 years
ago.

EXAM:
CT CHEST WITHOUT CONTRAST
TECHNIQUE: Multidetector CT imaging of the chest was performed following the
standard protocol without IV contrast.

[Series 2: chest 2.00 · axial · 0.63mm/px · z∈[-1537,-1283]mm · 10 of 151 slices shown, 13 images]
[im 12/151  mediastinal]
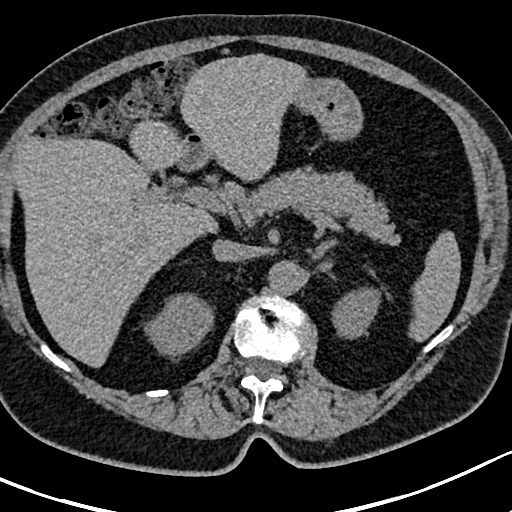
[im 12/151  lung]
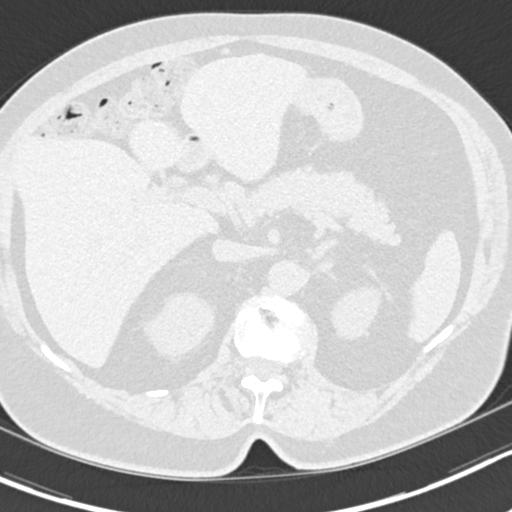
[im 24/151  lung]
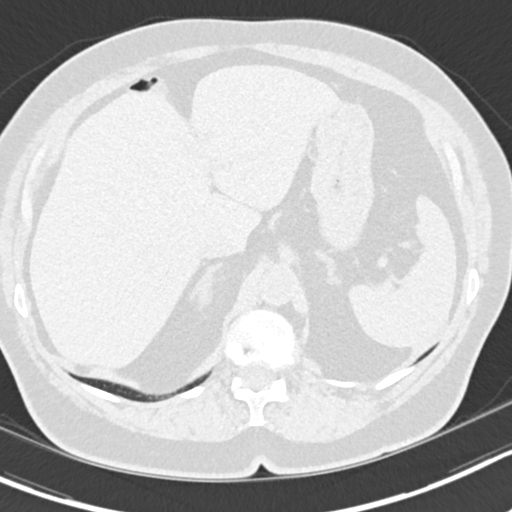
[im 47/151  lung]
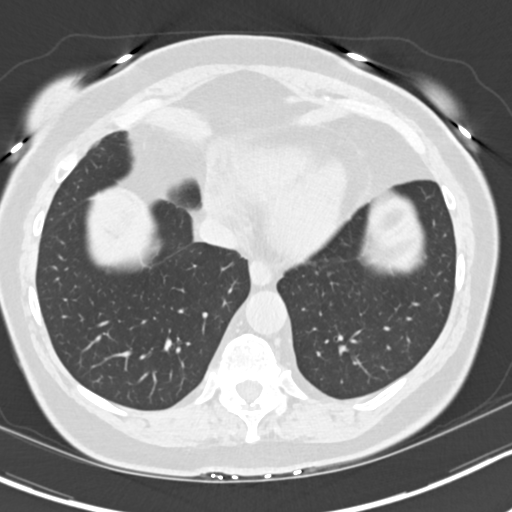
[im 58/151  lung]
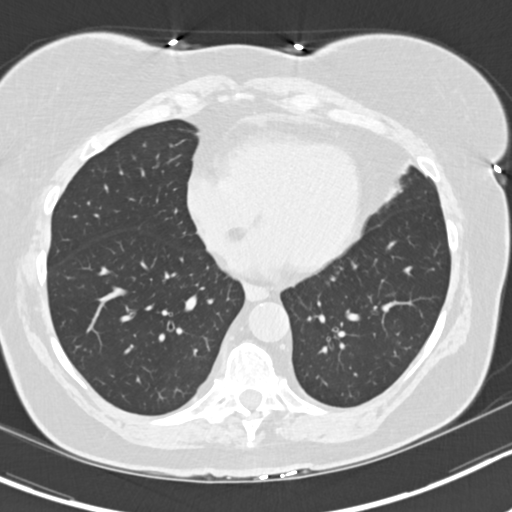
[im 70/151  mediastinal]
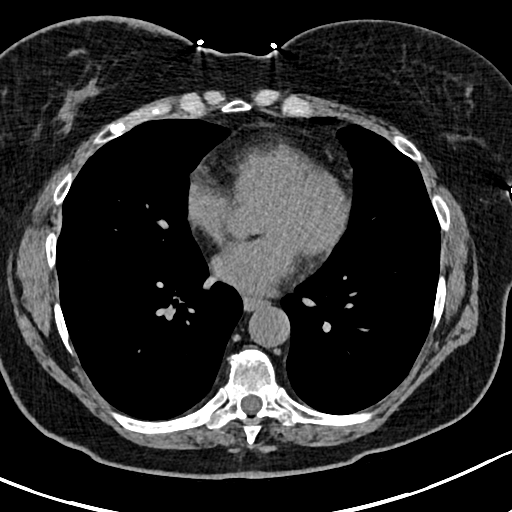
[im 70/151  lung]
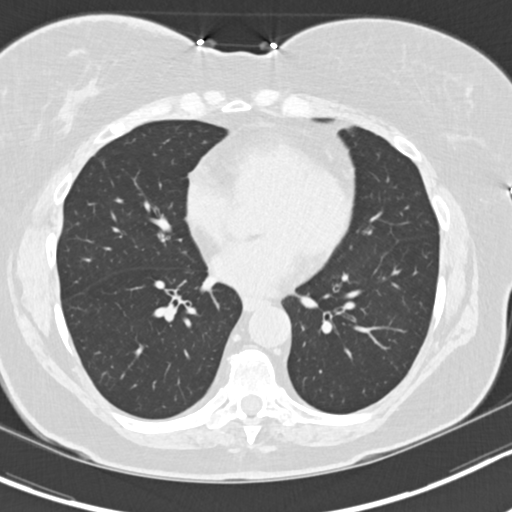
[im 81/151  lung]
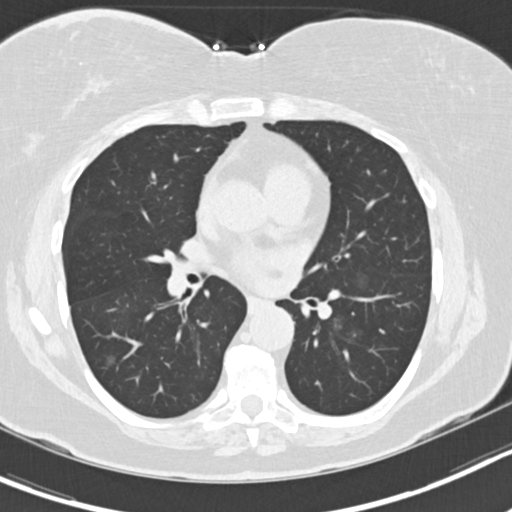
[im 93/151  lung]
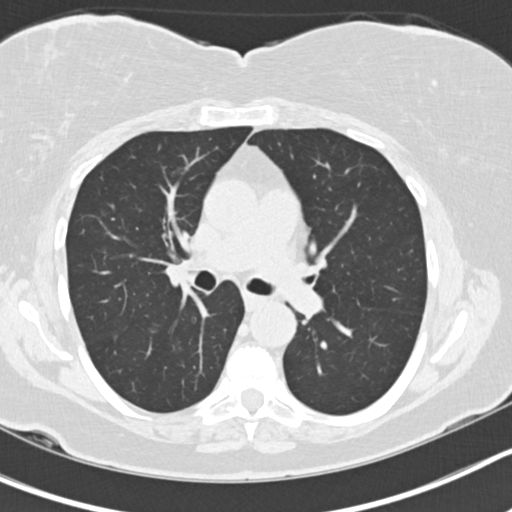
[im 116/151  lung]
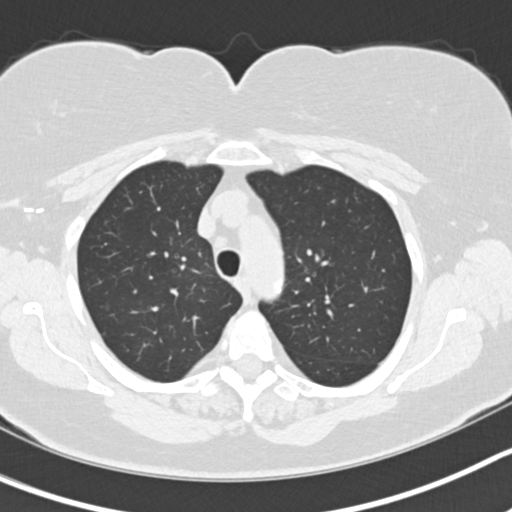
[im 127/151  mediastinal]
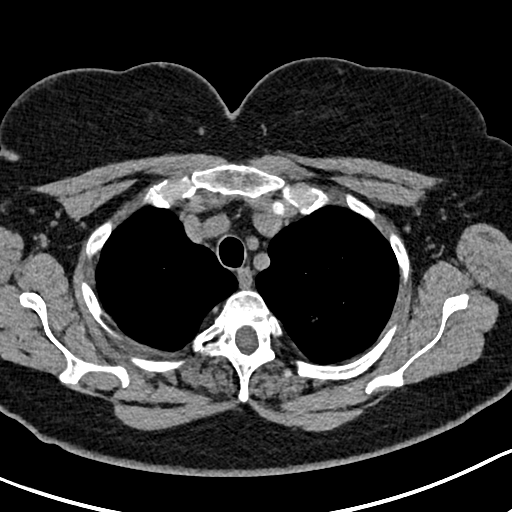
[im 127/151  lung]
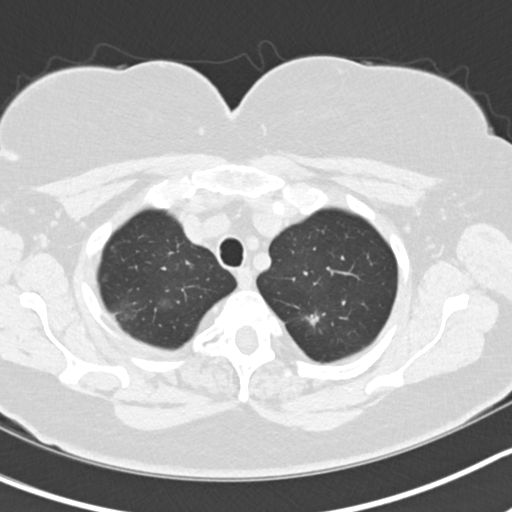
[im 139/151  lung]
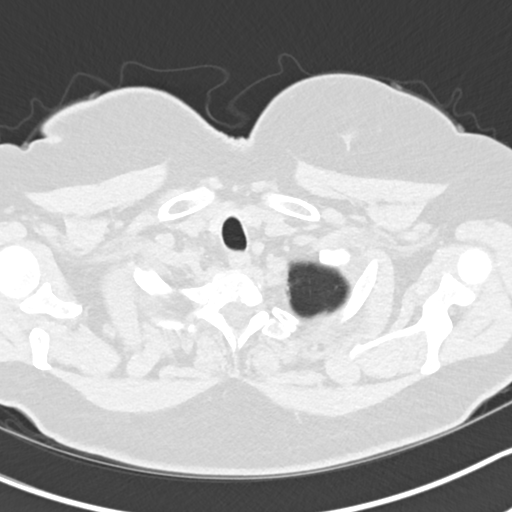

[Series 5: coronals chest 2.00 cor · coronal · 0.59mm/px · 3 of 146 slices shown]
[im 30/146  lung]
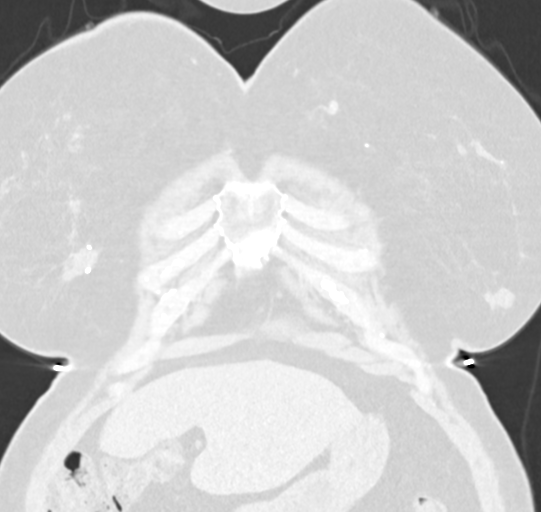
[im 59/146  lung]
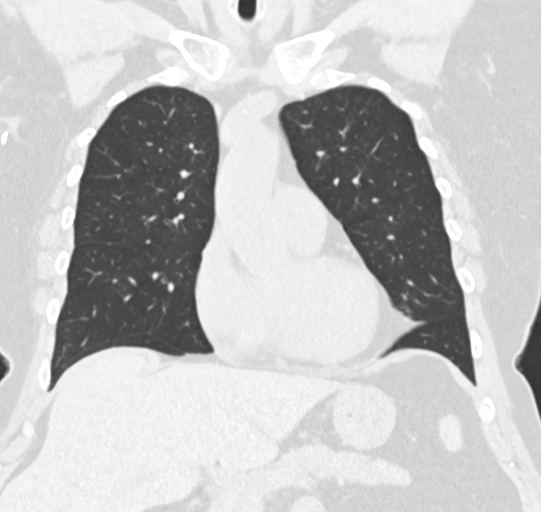
[im 88/146  lung]
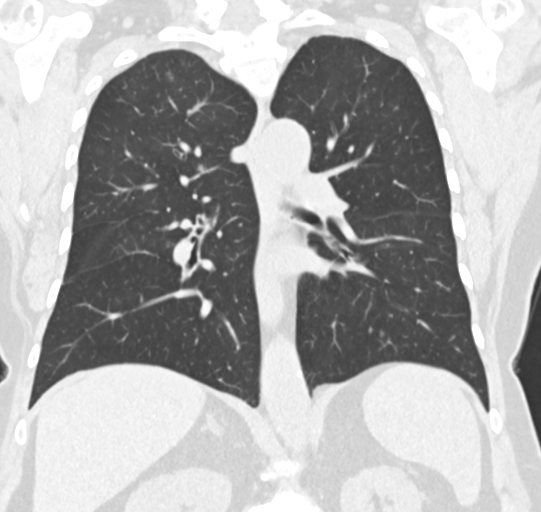

[13 of 36 positions shown; findings below may reference images not displayed]

FINDINGS: Cardiovascular: Coronary artery calcifications are present. Heart
size is normal. No pericardial effusion. There is atherosclerotic
calcification of the thoracic aorta. No aneurysm.

Mediastinum/Nodes: The visualized portion of the thyroid gland has a
normal appearance. Esophagus is unremarkable. No significant
mediastinal, hilar, or axillary adenopathy.

Lungs/Pleura: Within the LEFT lung apex sub solid nodule is again
identified on image 26 of series 3. The solid component is 6
millimeters and stable. Nodule measures 1.6 x 1.5 centimeters,
previously 1.5 x 1.7 centimeters.

Sub solid nodule in the RIGHT lung apex is stable in appearance,
10.7 millimeters. Numerous additional ground-glass opacities are
identified throughout the UPPER and LOWER lobes bilaterally. These
appear stable. No new masses identified. No consolidations or
pleural effusions. Stable airway thickening.

Upper Abdomen: Cholecystectomy. Mildly nodular contour of the liver
raising the question of cirrhosis.

Musculoskeletal: Midthoracic degenerative changes. No lytic blastic
osseous lesions.

Postoperative changes identified in the LOWER INNER QUADRANT of the
RIGHT breast. RIGHT breast skin thickening. Findings are consistent
with prior lumpectomy.
IMPRESSION: 1. Stable appearance of sub solid and ground-glass pulmonary nodules
bilaterally.
2. Stable appearance of largest sub solid nodule in the LEFT lung
apex. Solid component continued shin measure 6 millimeters.
Appearance has been stable for a total of 4 years.
3. Annual CT is recommended until 5 years of stability has been
established. If persistent these nodules should be considered highly
suspicious if the solid component of the nodule is 6 mm or greater
in size and enlarging. This recommendation follows the consensus
statement: Guidelines for Management of Incidental Pulmonary Nodules
Detected on CT Images: From the [HOSPITAL] 9911; Radiology
9911; [DATE].
4. Coronary artery disease.
5. Cholecystectomy.
6. Postoperative changes in the RIGHT breast.
7. Possible cirrhosis.

ADDENDUM:
Study is reviewed and discussed with Dr. Juxtcalme Tiger.

Given the long-term stability of pulmonary nodules since [DATE]
additional follow-up CT of the chest is recommended in 1 year.

*** End of Addendum ***
FINDINGS: Cardiovascular: Coronary artery calcifications are present. Heart
size is normal. No pericardial effusion. There is atherosclerotic
calcification of the thoracic aorta. No aneurysm.

Mediastinum/Nodes: The visualized portion of the thyroid gland has a
normal appearance. Esophagus is unremarkable. No significant
mediastinal, hilar, or axillary adenopathy.

Lungs/Pleura: Within the LEFT lung apex sub solid nodule is again
identified on image 26 of series 3. The solid component is 6
millimeters and stable. Nodule measures 1.6 x 1.5 centimeters,
previously 1.5 x 1.7 centimeters.

Sub solid nodule in the RIGHT lung apex is stable in appearance,
10.7 millimeters. Numerous additional ground-glass opacities are
identified throughout the UPPER and LOWER lobes bilaterally. These
appear stable. No new masses identified. No consolidations or
pleural effusions. Stable airway thickening.

Upper Abdomen: Cholecystectomy. Mildly nodular contour of the liver
raising the question of cirrhosis.

Musculoskeletal: Midthoracic degenerative changes. No lytic blastic
osseous lesions.

Postoperative changes identified in the LOWER INNER QUADRANT of the
RIGHT breast. RIGHT breast skin thickening. Findings are consistent
with prior lumpectomy.
IMPRESSION: 1. Stable appearance of sub solid and ground-glass pulmonary nodules
bilaterally.
2. Stable appearance of largest sub solid nodule in the LEFT lung
apex. Solid component continued shin measure 6 millimeters.
Appearance has been stable for a total of 4 years.
3. Annual CT is recommended until 5 years of stability has been
established. If persistent these nodules should be considered highly
suspicious if the solid component of the nodule is 6 mm or greater
in size and enlarging. This recommendation follows the consensus
statement: Guidelines for Management of Incidental Pulmonary Nodules
Detected on CT Images: From the [HOSPITAL] 9911; Radiology
9911; [DATE].
4. Coronary artery disease.
5. Cholecystectomy.
6. Postoperative changes in the RIGHT breast.
7. Possible cirrhosis.

## 2022-04-14 ENCOUNTER — Other Ambulatory Visit: Payer: Self-pay | Admitting: Internal Medicine

## 2022-04-14 DIAGNOSIS — R911 Solitary pulmonary nodule: Secondary | ICD-10-CM

## 2022-04-29 ENCOUNTER — Ambulatory Visit
Admission: RE | Admit: 2022-04-29 | Discharge: 2022-04-29 | Disposition: A | Discharge status: Home / Self Care | Payer: Medicare Other | Source: Ambulatory Visit | Attending: Internal Medicine | Admitting: Internal Medicine

## 2022-04-29 DIAGNOSIS — R911 Solitary pulmonary nodule: Secondary | ICD-10-CM | POA: Insufficient documentation

## 2022-04-29 MED ORDER — IOHEXOL 300 MG/ML  SOLN
75.0000 mL | Freq: Once | INTRAMUSCULAR | Status: AC | PRN
  Administered 2022-04-29: 75 mL via INTRAVENOUS

## 2022-10-27 ENCOUNTER — Ambulatory Visit
Admission: RE | Admit: 2022-10-27 | Discharge: 2022-10-27 | Disposition: A | Discharge status: Home / Self Care | Payer: Medicare Other | Source: Ambulatory Visit | Attending: Internal Medicine | Admitting: Internal Medicine

## 2022-10-27 ENCOUNTER — Ambulatory Visit: Payer: Medicare Other | Admitting: Anesthesiology

## 2022-10-27 ENCOUNTER — Encounter
Admission: RE | Disposition: A | Discharge status: Home / Self Care | Payer: Self-pay | Source: Ambulatory Visit | Attending: Internal Medicine

## 2022-10-27 ENCOUNTER — Encounter: Payer: Self-pay | Admitting: Internal Medicine

## 2022-10-27 ENCOUNTER — Other Ambulatory Visit: Payer: Self-pay

## 2022-10-27 DIAGNOSIS — Z1211 Encounter for screening for malignant neoplasm of colon: Secondary | ICD-10-CM | POA: Diagnosis present

## 2022-10-27 DIAGNOSIS — K640 First degree hemorrhoids: Secondary | ICD-10-CM | POA: Insufficient documentation

## 2022-10-27 DIAGNOSIS — Z79899 Other long term (current) drug therapy: Secondary | ICD-10-CM | POA: Diagnosis not present

## 2022-10-27 DIAGNOSIS — I2510 Atherosclerotic heart disease of native coronary artery without angina pectoris: Secondary | ICD-10-CM | POA: Diagnosis not present

## 2022-10-27 DIAGNOSIS — I10 Essential (primary) hypertension: Secondary | ICD-10-CM | POA: Diagnosis not present

## 2022-10-27 DIAGNOSIS — Z86010 Personal history of colonic polyps: Secondary | ICD-10-CM | POA: Insufficient documentation

## 2022-10-27 DIAGNOSIS — E785 Hyperlipidemia, unspecified: Secondary | ICD-10-CM | POA: Insufficient documentation

## 2022-10-27 DIAGNOSIS — G4730 Sleep apnea, unspecified: Secondary | ICD-10-CM | POA: Diagnosis not present

## 2022-10-27 DIAGNOSIS — J449 Chronic obstructive pulmonary disease, unspecified: Secondary | ICD-10-CM | POA: Insufficient documentation

## 2022-10-27 DIAGNOSIS — K635 Polyp of colon: Secondary | ICD-10-CM | POA: Diagnosis not present

## 2022-10-27 HISTORY — PX: COLONOSCOPY WITH PROPOFOL: SHX5780

## 2022-10-27 SURGERY — COLONOSCOPY WITH PROPOFOL
Anesthesia: General

## 2022-10-27 MED ORDER — PROPOFOL 10 MG/ML IV BOLUS
INTRAVENOUS | Status: DC | PRN
  Administered 2022-10-27: 50 mg via INTRAVENOUS
  Administered 2022-10-27: 30 mg via INTRAVENOUS

## 2022-10-27 MED ORDER — PROPOFOL 500 MG/50ML IV EMUL
INTRAVENOUS | Status: DC | PRN
  Administered 2022-10-27: 100 ug/kg/min via INTRAVENOUS

## 2022-10-27 MED ORDER — LIDOCAINE HCL (PF) 2 % IJ SOLN
INTRAMUSCULAR | Status: AC
  Filled 2022-10-27: qty 5

## 2022-10-27 MED ORDER — PROPOFOL 1000 MG/100ML IV EMUL
INTRAVENOUS | Status: AC
  Filled 2022-10-27: qty 100

## 2022-10-27 MED ORDER — LIDOCAINE HCL (CARDIAC) PF 100 MG/5ML IV SOSY
PREFILLED_SYRINGE | INTRAVENOUS | Status: DC | PRN
  Administered 2022-10-27: 50 mg via INTRAVENOUS

## 2022-10-27 MED ORDER — SODIUM CHLORIDE 0.9 % IV SOLN: INTRAVENOUS | Status: DC

## 2022-10-27 MED ORDER — MIDAZOLAM HCL 2 MG/2ML IJ SOLN
INTRAMUSCULAR | Status: AC
  Filled 2022-10-27: qty 2

## 2022-10-27 NOTE — Transfer of Care (Signed)
Immediate Anesthesia Transfer of Care Note  Patient: NIHAL DOAN  Procedure(s) Performed: COLONOSCOPY WITH PROPOFOL  Patient Location: PACU  Anesthesia Type:General  Level of Consciousness: sedated  Airway & Oxygen Therapy: Patient Spontanous Breathing  Post-op Assessment: Report given to RN and Post -op Vital signs reviewed and stable  Post vital signs: Reviewed and stable  Last Vitals:  Vitals Value Taken Time  BP 138/64 10/27/22 1044  Temp    Pulse 80 10/27/22 1044  Resp 21 10/27/22 1044  SpO2 96 % 10/27/22 1044  Vitals shown include unvalidated device data.  Last Pain:  Vitals:   10/27/22 0947  TempSrc: Temporal  PainSc: 0-No pain         Complications: No notable events documented.

## 2022-10-27 NOTE — Op Note (Signed)
[facility] Gastroenterology Patient Name: Danielle Santana Procedure Date: 10/27/2022 10:18 AM MRN: [id number] Account #: [id number] Date of Birth: 25-Mar-1946 Admit Type: Outpatient Age: 76 Room: [facility] ENDO ROOM 2 Gender: Female Note Status: Finalized Instrument Name: Jasper Riling [id number] Procedure:             Colonoscopy Indications:           High risk colon cancer surveillance: Personal history                         of non-advanced adenoma Providers:             Benay Pike. Alice Reichert MD, MD Referring MD:          Ocie Cornfield. Ouida Sills MD, MD (Referring MD) Medicines:             Propofol per Anesthesia Complications:         No immediate complications. Procedure:             Pre-Anesthesia Assessment:                        - The risks and benefits of the procedure and the                         sedation options and risks were discussed with the                         patient. All questions were answered and informed                         consent was obtained.                        - Patient identification and proposed procedure were                         verified prior to the procedure by the nurse. The                         procedure was verified in the procedure room.                        - ASA Grade Assessment: III - A patient with severe                         systemic disease.                        - After reviewing the risks and benefits, the patient                         was deemed in satisfactory condition to undergo the                         procedure.                        After obtaining informed consent, the colonoscope was                         passed  under direct vision. Throughout the procedure,                         the patient's blood pressure, pulse, and oxygen                         saturations were monitored continuously. The                         Colonoscope was introduced through the anus and                          advanced to the the cecum, identified by appendiceal                         orifice and ileocecal valve. The colonoscopy was                         performed without difficulty. The patient tolerated                         the procedure well. The quality of the bowel                         preparation was good. The ileocecal valve, appendiceal                         orifice, and rectum were photographed. Findings:      The perianal and digital rectal examinations were normal. Pertinent       negatives include normal sphincter tone and no palpable rectal lesions.      Non-bleeding internal hemorrhoids were found during retroflexion. The       hemorrhoids were Grade I (internal hemorrhoids that do not prolapse).      A 3 mm polyp was found in the transverse colon. The polyp was sessile.       The polyp was removed with a jumbo cold forceps. Resection and retrieval       were complete.      The exam was otherwise without abnormality. Impression:            - Non-bleeding internal hemorrhoids.                        - One 3 mm polyp in the transverse colon, removed with                         a jumbo cold forceps. Resected and retrieved.                        - The examination was otherwise normal. Recommendation:        - Patient has a contact number available for                         emergencies. The signs and symptoms of potential                         delayed complications were discussed with the patient.  Return to normal activities tomorrow. Written                         discharge instructions were provided to the patient.                        - Resume previous diet.                        - Continue present medications.                        - Await pathology results.                        - If polyps are benign or adenomatous without                         dysplasia, I will advise NO further colonoscopy due to                         advanced  age and/or severe comorbidity.                        - Return to GI office PRN.                        - The findings and recommendations were discussed with                         the patient. Procedure Code(s):     --- Professional ---                        [phone], Colonoscopy, flexible; with biopsy, single or                         multiple Diagnosis Code(s):     --- Professional ---                        K64.0, First degree hemorrhoids                        D12.3, Benign neoplasm of transverse colon (hepatic                         flexure or splenic flexure)                        Z86.010, Personal history of colonic polyps CPT copyright 2022 [employer]. All rights reserved. The codes documented in this report are preliminary and upon coder review may  be revised to meet current compliance requirements. Efrain Sella MD, MD 10/27/2022 10:43:38 AM This report has been signed electronically. Number of Addenda: 0 Note Initiated On: 10/27/2022 10:18 AM Scope Withdrawal Time: 0 hours 5 minutes 39 seconds  Total Procedure Duration: 0 hours 12 minutes 34 seconds  Estimated Blood Loss:  Estimated blood loss: none.      [facility]

## 2022-10-27 NOTE — Anesthesia Preprocedure Evaluation (Signed)
Anesthesia Evaluation  Patient identified by MRN, date of birth, ID band Patient awake    Reviewed: Allergy & Precautions, NPO status , Patient's Chart, lab work & pertinent test results  History of Anesthesia Complications Negative for: history of anesthetic complications  Airway Mallampati: II  TM Distance: >3 FB Neck ROM: full    Dental  (+) Teeth Intact   Pulmonary sleep apnea and Continuous Positive Airway Pressure Ventilation , COPD,  COPD inhaler, former smoker   Pulmonary exam normal        Cardiovascular hypertension, On Medications (-) angina + CAD  (-) DOE Normal cardiovascular exam     Neuro/Psych negative neurological ROS  negative psych ROS   GI/Hepatic negative GI ROS, Neg liver ROS,,,  Endo/Other  negative endocrine ROS    Renal/GU negative Renal ROS  negative genitourinary   Musculoskeletal   Abdominal   Peds  Hematology negative hematology ROS (+)   Anesthesia Other Findings Past Medical History: No date: Adenomatous polyps No date: Allergic rhinitis No date: Anemia No date: Back pain     Comment:  receiving injections No date: Cancer [facility])     Comment:  right No date: COPD (chronic obstructive pulmonary disease) (HCC) No date: COVID-19 No date: Diarrhea No date: Family history of adverse reaction to anesthesia     Comment:  paternal great aunt died during surgery No date: Hyperlipidemia No date: Hypertension No date: Lung nodule No date: Sleep apnea     Comment:  declines cpap for now  No date: Spinal stenosis  Past Surgical History: 2002: ABDOMINAL HYSTERECTOMY No date: BREAST BIOPSY; Left     Comment:  Stereo biopsy - negative 12/22/2017: BREAST BIOPSY; Right     Comment:  path pending 2010: CHOLECYSTECTOMY 05/16/2017: COLONOSCOPY WITH PROPOFOL; N/A     Comment:  Procedure: COLONOSCOPY WITH PROPOFOL;  Surgeon: Manya Silvas, MD;  Location: [facility] ENDOSCOPY;   Service:               Endoscopy;  Laterality: N/A; No date: EYE SURGERY; Bilateral     Comment:  cataracts No date: MASTECTOMY No date: OVARIAN CYST SURGERY 07/13/2021: SHOULDER ARTHROSCOPY WITH SUBACROMIAL DECOMPRESSION AND  OPEN ROTATOR C; Right     Comment:  Procedure: Right shoulder arthroscopic vs mini-open               rotator cuff repair (subscapularis and supraspinatus),               subacromial decompression, and biceps tenodesis;                Surgeon: Leim Fabry, MD;  Location: [facility] ORS;  Service:              Orthopedics;  Laterality: Right;  BMI    Body Mass Index: 30.23 kg/m      Reproductive/Obstetrics negative OB ROS                              Anesthesia Physical Anesthesia Plan  ASA: 3  Anesthesia Plan: General   Post-op Pain Management: Minimal or no pain anticipated   Induction: Intravenous  PONV Risk Score and Plan: Propofol infusion and TIVA  Airway Management Planned: Natural Airway and Nasal Cannula  Additional Equipment:   Intra-op Plan:   Post-operative Plan:   Informed Consent: I have reviewed the patients History and Physical,  chart, labs and discussed the procedure including the risks, benefits and alternatives for the proposed anesthesia with the patient or authorized representative who has indicated his/her understanding and acceptance.     Dental Advisory Given  Plan Discussed with: Anesthesiologist, CRNA and Surgeon  Anesthesia Plan Comments: (Patient consented for risks of anesthesia including but not limited to:  - adverse reactions to medications - risk of airway placement if required - damage to eyes, teeth, lips or other oral mucosa - nerve damage due to positioning  - sore throat or hoarseness - Damage to heart, brain, nerves, lungs, other parts of body or loss of life  Patient voiced understanding.)         Anesthesia Quick Evaluation

## 2022-10-27 NOTE — H&P (Signed)
Outpatient short stay form Pre-procedure 10/27/2022 9:33 AM Danielle Santana K. Alice Reichert, M.D.  Primary Physician: Frazier Richards, M.D.  Reason for visit:  Personal history of colon polyps. LAST: 05/16/2017 for colonoscopy for personal history of colon polyps and it was notable for 3 diminutive polyps, 3 nonbleeding colonic angiectasia's, and internal hemorrhoids. The pathology returned tubular adenomas. Colon mucosal lining was not biopsied.   History of present illness:                            Patient presents for colonoscopy for a personal hx of colon polyps. The patient denies abdominal pain, abnormal weight loss or rectal bleeding.  Hx of postcholecystectomy diarrhea.   No current facility-administered medications for this encounter.  Medications Prior to Admission  Medication Sig Dispense Refill Last Dose   acetaminophen (TYLENOL) 500 MG tablet Take 500 mg by mouth every 6 (six) hours as needed for headache or mild pain.      albuterol (VENTOLIN HFA) 108 (90 Base) MCG/ACT inhaler Inhale 2 puffs into the lungs every 6 (six) hours as needed for wheezing or shortness of breath.      amLODipine (NORVASC) 5 MG tablet Take 5 mg by mouth at bedtime.      Ascorbic Acid (VITAMIN C) 1000 MG tablet Take 1,000 mg by mouth daily.      augmented betamethasone dipropionate (DIPROLENE-AF) 0.05 % cream Apply 1 application topically daily as needed (bug bite).      Cholecalciferol (VITAMIN D) 50 MCG (2000 UT) tablet Take 2,000 Units by mouth daily.      diphenhydrAMINE (BENADRYL) 25 mg capsule Take 25 mg by mouth daily as needed for allergies.      fluticasone (FLONASE) 50 MCG/ACT nasal spray Place 2 sprays into both nostrils daily as needed for allergies or rhinitis.      letrozole (FEMARA) 2.5 MG tablet Take 2.5 mg by mouth at bedtime.      ondansetron (ZOFRAN ODT) 4 MG disintegrating tablet Take 1 tablet (4 mg total) by mouth every 8 (eight) hours as needed for nausea or vomiting. 20 tablet 0    Oyster  Shell (OYSTER CALCIUM) 500 MG TABS tablet Take 500 mg of elemental calcium by mouth daily.      pravastatin (PRAVACHOL) 40 MG tablet Take 40 mg by mouth at bedtime.      Propylene Glycol (SYSTANE BALANCE) 0.6 % SOLN Place 1 drop into both eyes 2 (two) times daily as needed (dry eyes).      trolamine salicylate (ASPERCREME) 10 % cream Apply 1 application topically as needed for muscle pain.      vitamin B-12 (CYANOCOBALAMIN) 1000 MCG tablet Take 1,000 mcg by mouth daily.        No Known Allergies   Past Medical History:  Diagnosis Date   Adenomatous polyps    Allergic rhinitis    Anemia    Back pain    receiving injections   Cancer (HCC)    right   COPD (chronic obstructive pulmonary disease) (HCC)    COVID-19    Diarrhea    Family history of adverse reaction to anesthesia    paternal great aunt died during surgery   Hyperlipidemia    Hypertension    Lung nodule    Sleep apnea    declines cpap for now    Spinal stenosis     Review of systems:  Otherwise negative.    Physical Exam  Gen: Alert,  oriented. Appears stated age.  HEENT: [place]/AT. PERRLA. Lungs: CTA, no wheezes. CV: RR nl S1, S2. Abd: soft, benign, no masses. BS+ Ext: No edema. Pulses 2+    Planned procedures: Proceed with colonoscopy. The patient understands the nature of the planned procedure, indications, risks, alternatives and potential complications including but not limited to bleeding, infection, perforation, damage to internal organs and possible oversedation/side effects from anesthesia. The patient agrees and gives consent to proceed.  Please refer to procedure notes for findings, recommendations and patient disposition/instructions.     Devante Capano K. Alice Reichert, M.D. Gastroenterology 10/27/2022  9:33 AM    Jenetta Downer

## 2022-10-27 NOTE — Anesthesia Postprocedure Evaluation (Signed)
Anesthesia Post Note  Patient: Danielle Santana  Procedure(s) Performed: COLONOSCOPY WITH PROPOFOL  Patient location during evaluation: Endoscopy Anesthesia Type: General Level of consciousness: awake and alert Pain management: pain level controlled Vital Signs Assessment: post-procedure vital signs reviewed and stable Respiratory status: spontaneous breathing, nonlabored ventilation, respiratory function stable and patient connected to nasal cannula oxygen Cardiovascular status: blood pressure returned to baseline and stable Postop Assessment: no apparent nausea or vomiting Anesthetic complications: no  No notable events documented.   Last Vitals:  Vitals:   10/27/22 1044 10/27/22 1054  BP: 138/64 (!) 154/83  Pulse: 79 92  Resp: 16 (!) 22  Temp: (!) 36.2 C   SpO2: 96% 99%    Last Pain:  Vitals:   10/27/22 1054  TempSrc:   PainSc: 0-No pain                 Ilene Qua

## 2022-10-28 ENCOUNTER — Encounter: Payer: Self-pay | Admitting: Internal Medicine

## 2022-10-28 LAB — SURGICAL PATHOLOGY

## 2023-04-15 ENCOUNTER — Other Ambulatory Visit: Payer: Self-pay | Admitting: Internal Medicine

## 2023-04-15 DIAGNOSIS — M799 Soft tissue disorder, unspecified: Secondary | ICD-10-CM

## 2023-04-15 DIAGNOSIS — R911 Solitary pulmonary nodule: Secondary | ICD-10-CM

## 2023-04-15 DIAGNOSIS — R9389 Abnormal findings on diagnostic imaging of other specified body structures: Secondary | ICD-10-CM

## 2023-04-15 DIAGNOSIS — Z170 Estrogen receptor positive status [ER+]: Secondary | ICD-10-CM

## 2023-04-28 ENCOUNTER — Ambulatory Visit
Admission: RE | Admit: 2023-04-28 | Discharge: 2023-04-28 | Disposition: A | Discharge status: Home / Self Care | Payer: Medicare Other | Source: Ambulatory Visit | Attending: Internal Medicine | Admitting: Internal Medicine

## 2023-04-28 DIAGNOSIS — R9389 Abnormal findings on diagnostic imaging of other specified body structures: Secondary | ICD-10-CM | POA: Insufficient documentation

## 2023-04-28 DIAGNOSIS — C50411 Malignant neoplasm of upper-outer quadrant of right female breast: Secondary | ICD-10-CM | POA: Insufficient documentation

## 2023-04-28 DIAGNOSIS — Z170 Estrogen receptor positive status [ER+]: Secondary | ICD-10-CM | POA: Insufficient documentation

## 2023-04-28 DIAGNOSIS — M799 Soft tissue disorder, unspecified: Secondary | ICD-10-CM | POA: Diagnosis present

## 2023-04-28 DIAGNOSIS — R911 Solitary pulmonary nodule: Secondary | ICD-10-CM | POA: Insufficient documentation

## 2023-04-28 MED ORDER — IOHEXOL 300 MG/ML  SOLN
75.0000 mL | Freq: Once | INTRAMUSCULAR | Status: AC | PRN
  Administered 2023-04-28: 75 mL via INTRAVENOUS

## 2023-11-10 ENCOUNTER — Other Ambulatory Visit
Admission: RE | Admit: 2023-11-10 | Discharge: 2023-11-10 | Disposition: A | Discharge status: Home / Self Care | Payer: Medicare Other | Source: Ambulatory Visit | Attending: Internal Medicine | Admitting: Internal Medicine

## 2023-11-10 DIAGNOSIS — M79661 Pain in right lower leg: Secondary | ICD-10-CM | POA: Diagnosis present

## 2023-11-10 LAB — D-DIMER, QUANTITATIVE: D-Dimer, Quant: 0.41 ug{FEU}/mL (ref 0.00–0.50)

## 2024-02-03 ENCOUNTER — Other Ambulatory Visit: Payer: Self-pay | Admitting: Student

## 2024-02-03 DIAGNOSIS — M5416 Radiculopathy, lumbar region: Secondary | ICD-10-CM

## 2024-02-21 ENCOUNTER — Ambulatory Visit
Admission: RE | Admit: 2024-02-21 | Discharge: 2024-02-21 | Disposition: A | Discharge status: Home / Self Care | Source: Ambulatory Visit | Attending: Student | Admitting: Student

## 2024-02-21 DIAGNOSIS — M5416 Radiculopathy, lumbar region: Secondary | ICD-10-CM

## 2024-02-21 MED ORDER — GADOPICLENOL 0.5 MMOL/ML IV SOLN
7.5000 mL | Freq: Once | INTRAVENOUS | Status: AC | PRN
  Administered 2024-02-21: 7.5 mL via INTRAVENOUS

## 2024-07-10 ENCOUNTER — Other Ambulatory Visit: Payer: Self-pay | Admitting: Unknown Physician Specialty

## 2024-07-10 DIAGNOSIS — K1121 Acute sialoadenitis: Secondary | ICD-10-CM

## 2024-07-13 ENCOUNTER — Ambulatory Visit
Admission: RE | Admit: 2024-07-13 | Discharge: 2024-07-13 | Disposition: A | Discharge status: Home / Self Care | Source: Ambulatory Visit | Attending: Unknown Physician Specialty | Admitting: Unknown Physician Specialty

## 2024-07-13 DIAGNOSIS — K1121 Acute sialoadenitis: Secondary | ICD-10-CM

## 2024-07-13 MED ORDER — IOPAMIDOL (ISOVUE-300) INJECTION 61%
75.0000 mL | Freq: Once | INTRAVENOUS | Status: AC | PRN
  Administered 2024-07-13: 75 mL via INTRAVENOUS
